# Patient Record
Sex: Female | Born: 1965 | Race: Black or African American | Hispanic: No | Marital: Single | Smoking: Never smoker
Health system: Southern US, Community
[De-identification: ages and names within clinical notes are randomized; demographics above are authoritative.]

## PROBLEM LIST (undated history)

## (undated) DIAGNOSIS — I10 Essential (primary) hypertension: Secondary | ICD-10-CM

## (undated) DIAGNOSIS — R51 Headache: Secondary | ICD-10-CM

## (undated) HISTORY — PX: APPENDECTOMY: SHX54

## (undated) HISTORY — PX: ABDOMINAL HYSTERECTOMY: SHX81

## (undated) HISTORY — PX: BREAST LUMPECTOMY: SHX2

---

## 1998-07-16 ENCOUNTER — Emergency Department (HOSPITAL_COMMUNITY): Admission: EM | Admit: 1998-07-16 | Discharge: 1998-07-16 | Payer: Self-pay | Admitting: Emergency Medicine

## 1998-10-14 ENCOUNTER — Encounter: Payer: Self-pay | Admitting: Emergency Medicine

## 1998-10-14 ENCOUNTER — Emergency Department (HOSPITAL_COMMUNITY): Admission: EM | Admit: 1998-10-14 | Discharge: 1998-10-14 | Payer: Self-pay | Admitting: Emergency Medicine

## 1999-01-29 ENCOUNTER — Emergency Department (HOSPITAL_COMMUNITY): Admission: EM | Admit: 1999-01-29 | Discharge: 1999-01-29 | Payer: Self-pay | Admitting: Emergency Medicine

## 1999-01-29 ENCOUNTER — Encounter: Payer: Self-pay | Admitting: Emergency Medicine

## 1999-06-06 ENCOUNTER — Emergency Department (HOSPITAL_COMMUNITY): Admission: EM | Admit: 1999-06-06 | Discharge: 1999-06-06 | Payer: Self-pay | Admitting: Emergency Medicine

## 1999-08-19 ENCOUNTER — Emergency Department (HOSPITAL_COMMUNITY): Admission: EM | Admit: 1999-08-19 | Discharge: 1999-08-19 | Payer: Self-pay | Admitting: Emergency Medicine

## 1999-08-20 ENCOUNTER — Emergency Department (HOSPITAL_COMMUNITY): Admission: EM | Admit: 1999-08-20 | Discharge: 1999-08-21 | Payer: Self-pay

## 1999-11-14 ENCOUNTER — Emergency Department (HOSPITAL_COMMUNITY): Admission: EM | Admit: 1999-11-14 | Discharge: 1999-11-14 | Payer: Self-pay | Admitting: Emergency Medicine

## 1999-12-08 ENCOUNTER — Emergency Department (HOSPITAL_COMMUNITY): Admission: EM | Admit: 1999-12-08 | Discharge: 1999-12-08 | Payer: Self-pay | Admitting: Emergency Medicine

## 1999-12-08 ENCOUNTER — Encounter: Payer: Self-pay | Admitting: Emergency Medicine

## 2000-01-11 ENCOUNTER — Emergency Department (HOSPITAL_COMMUNITY): Admission: EM | Admit: 2000-01-11 | Discharge: 2000-01-11 | Payer: Self-pay | Admitting: Emergency Medicine

## 2000-06-24 ENCOUNTER — Emergency Department (HOSPITAL_COMMUNITY): Admission: EM | Admit: 2000-06-24 | Discharge: 2000-06-24 | Payer: Self-pay | Admitting: Emergency Medicine

## 2001-03-28 ENCOUNTER — Emergency Department (HOSPITAL_COMMUNITY): Admission: EM | Admit: 2001-03-28 | Discharge: 2001-03-28 | Payer: Self-pay | Admitting: Emergency Medicine

## 2001-04-11 ENCOUNTER — Emergency Department (HOSPITAL_COMMUNITY): Admission: EM | Admit: 2001-04-11 | Discharge: 2001-04-11 | Payer: Self-pay | Admitting: Emergency Medicine

## 2001-04-11 ENCOUNTER — Encounter: Payer: Self-pay | Admitting: Emergency Medicine

## 2001-06-02 ENCOUNTER — Encounter: Payer: Self-pay | Admitting: Emergency Medicine

## 2001-06-02 ENCOUNTER — Emergency Department (HOSPITAL_COMMUNITY): Admission: EM | Admit: 2001-06-02 | Discharge: 2001-06-02 | Payer: Self-pay | Admitting: Emergency Medicine

## 2001-09-13 ENCOUNTER — Encounter: Payer: Self-pay | Admitting: Emergency Medicine

## 2001-09-13 ENCOUNTER — Emergency Department (HOSPITAL_COMMUNITY): Admission: EM | Admit: 2001-09-13 | Discharge: 2001-09-13 | Payer: Self-pay | Admitting: Emergency Medicine

## 2001-11-21 ENCOUNTER — Emergency Department (HOSPITAL_COMMUNITY): Admission: EM | Admit: 2001-11-21 | Discharge: 2001-11-21 | Payer: Self-pay | Admitting: Emergency Medicine

## 2001-12-01 ENCOUNTER — Encounter: Payer: Self-pay | Admitting: Emergency Medicine

## 2001-12-01 ENCOUNTER — Emergency Department (HOSPITAL_COMMUNITY): Admission: EM | Admit: 2001-12-01 | Discharge: 2001-12-01 | Payer: Self-pay | Admitting: Emergency Medicine

## 2002-05-13 ENCOUNTER — Emergency Department (HOSPITAL_COMMUNITY): Admission: EM | Admit: 2002-05-13 | Discharge: 2002-05-13 | Payer: Self-pay | Admitting: *Deleted

## 2002-05-16 ENCOUNTER — Emergency Department (HOSPITAL_COMMUNITY): Admission: EM | Admit: 2002-05-16 | Discharge: 2002-05-16 | Payer: Self-pay | Admitting: Emergency Medicine

## 2002-12-17 ENCOUNTER — Emergency Department (HOSPITAL_COMMUNITY): Admission: EM | Admit: 2002-12-17 | Discharge: 2002-12-17 | Payer: Self-pay | Admitting: Emergency Medicine

## 2002-12-18 ENCOUNTER — Encounter: Payer: Self-pay | Admitting: Emergency Medicine

## 2003-12-12 ENCOUNTER — Emergency Department (HOSPITAL_COMMUNITY): Admission: EM | Admit: 2003-12-12 | Discharge: 2003-12-13 | Payer: Self-pay | Admitting: Emergency Medicine

## 2004-01-07 ENCOUNTER — Emergency Department (HOSPITAL_COMMUNITY): Admission: EM | Admit: 2004-01-07 | Discharge: 2004-01-07 | Payer: Self-pay

## 2004-11-05 ENCOUNTER — Emergency Department (HOSPITAL_COMMUNITY): Admission: EM | Admit: 2004-11-05 | Discharge: 2004-11-05 | Payer: Self-pay | Admitting: Emergency Medicine

## 2005-04-27 ENCOUNTER — Emergency Department (HOSPITAL_COMMUNITY): Admission: EM | Admit: 2005-04-27 | Discharge: 2005-04-28 | Payer: Self-pay | Admitting: Emergency Medicine

## 2005-06-08 ENCOUNTER — Emergency Department (HOSPITAL_COMMUNITY): Admission: EM | Admit: 2005-06-08 | Discharge: 2005-06-08 | Payer: Self-pay | Admitting: Emergency Medicine

## 2005-06-15 ENCOUNTER — Emergency Department (HOSPITAL_COMMUNITY): Admission: EM | Admit: 2005-06-15 | Discharge: 2005-06-16 | Payer: Self-pay | Admitting: *Deleted

## 2005-11-02 ENCOUNTER — Emergency Department (HOSPITAL_COMMUNITY): Admission: EM | Admit: 2005-11-02 | Discharge: 2005-11-02 | Payer: Self-pay | Admitting: Emergency Medicine

## 2005-11-11 ENCOUNTER — Emergency Department (HOSPITAL_COMMUNITY): Admission: EM | Admit: 2005-11-11 | Discharge: 2005-11-12 | Payer: Self-pay | Admitting: Emergency Medicine

## 2005-11-11 ENCOUNTER — Emergency Department (HOSPITAL_COMMUNITY): Admission: EM | Admit: 2005-11-11 | Discharge: 2005-11-11 | Payer: Self-pay | Admitting: Emergency Medicine

## 2005-12-02 ENCOUNTER — Emergency Department (HOSPITAL_COMMUNITY): Admission: EM | Admit: 2005-12-02 | Discharge: 2005-12-02 | Payer: Self-pay | Admitting: Emergency Medicine

## 2005-12-07 ENCOUNTER — Emergency Department (HOSPITAL_COMMUNITY): Admission: EM | Admit: 2005-12-07 | Discharge: 2005-12-07 | Payer: Self-pay | Admitting: Emergency Medicine

## 2006-04-14 ENCOUNTER — Observation Stay (HOSPITAL_COMMUNITY): Admission: EM | Admit: 2006-04-14 | Discharge: 2006-04-15 | Payer: Self-pay | Admitting: Emergency Medicine

## 2006-05-24 ENCOUNTER — Observation Stay (HOSPITAL_COMMUNITY): Admission: EM | Admit: 2006-05-24 | Discharge: 2006-05-26 | Payer: Self-pay | Admitting: Emergency Medicine

## 2006-06-01 ENCOUNTER — Inpatient Hospital Stay (HOSPITAL_COMMUNITY): Admission: AD | Admit: 2006-06-01 | Discharge: 2006-06-05 | Payer: Self-pay | Admitting: Obstetrics and Gynecology

## 2006-06-02 ENCOUNTER — Encounter (INDEPENDENT_AMBULATORY_CARE_PROVIDER_SITE_OTHER): Payer: Self-pay | Admitting: *Deleted

## 2006-06-17 ENCOUNTER — Emergency Department (HOSPITAL_COMMUNITY): Admission: EM | Admit: 2006-06-17 | Discharge: 2006-06-17 | Payer: Self-pay | Admitting: Emergency Medicine

## 2006-06-17 ENCOUNTER — Emergency Department (HOSPITAL_COMMUNITY): Admission: EM | Admit: 2006-06-17 | Discharge: 2006-06-18 | Payer: Self-pay | Admitting: Emergency Medicine

## 2006-06-24 ENCOUNTER — Emergency Department (HOSPITAL_COMMUNITY): Admission: EM | Admit: 2006-06-24 | Discharge: 2006-06-24 | Payer: Self-pay | Admitting: Family Medicine

## 2007-10-01 ENCOUNTER — Emergency Department (HOSPITAL_COMMUNITY): Admission: EM | Admit: 2007-10-01 | Discharge: 2007-10-01 | Payer: Self-pay | Admitting: Emergency Medicine

## 2007-11-03 ENCOUNTER — Emergency Department (HOSPITAL_COMMUNITY): Admission: EM | Admit: 2007-11-03 | Discharge: 2007-11-03 | Payer: Self-pay | Admitting: Emergency Medicine

## 2008-01-10 IMAGING — US US PELVIS COMPLETE
1 series · 3 of 3 positions shown · non-contrast
Comparison: Prior study dated 04/14/06.

CLINICAL DATA: Abdominal pain.  
 TRANSABDOMINAL PELVIC ULTRASOUND:
TECHNIQUE: Transabdominal ultrasound examination of the pelvis was performed including evaluation of the uterus, ovaries, adnexal regions, and pelvic cul-de-sac.

[Series 1: unknown · 0.15mm/px · 3 of 3 slices shown]
[im 1/3]
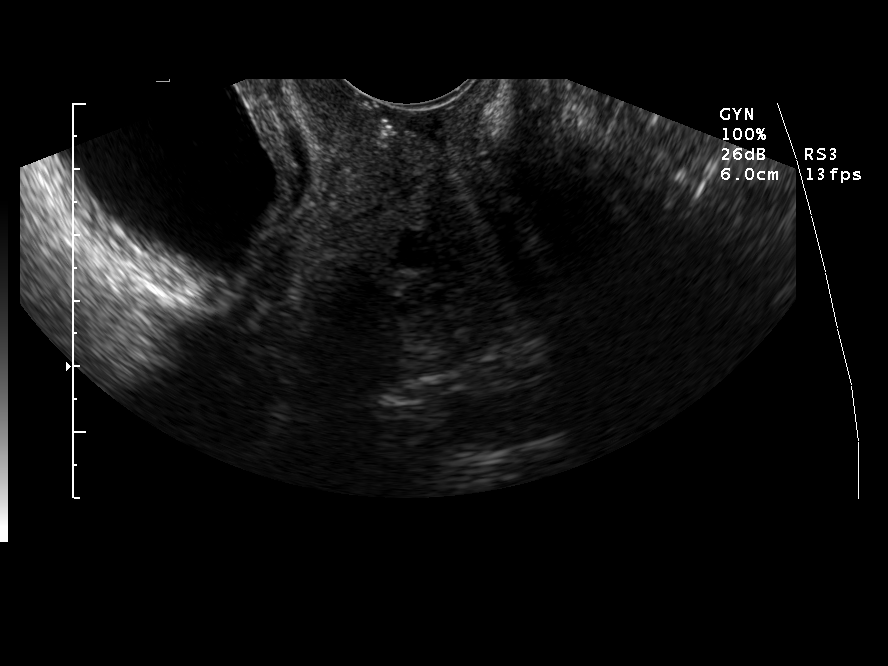
[im 2/3]
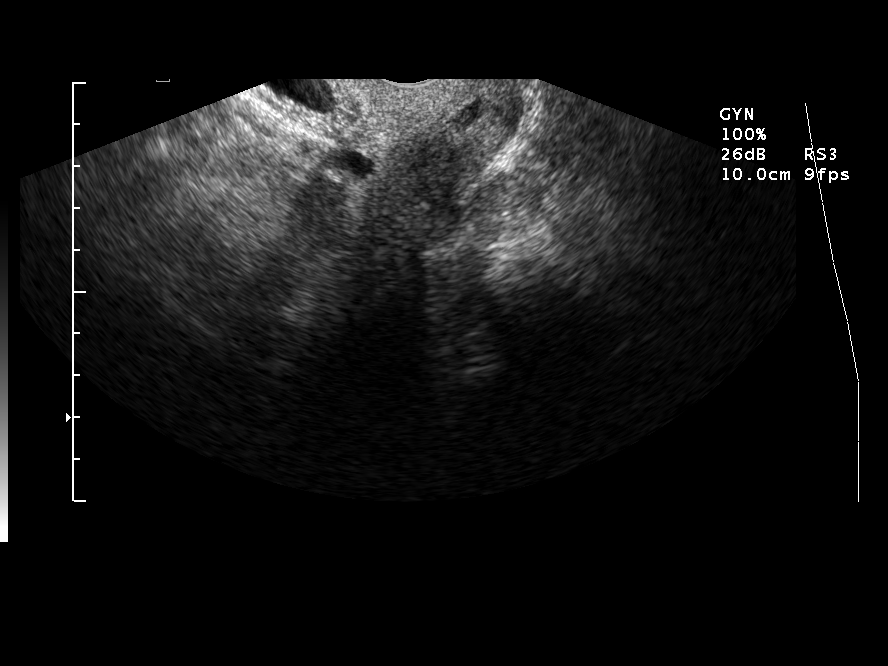
[im 3/3]
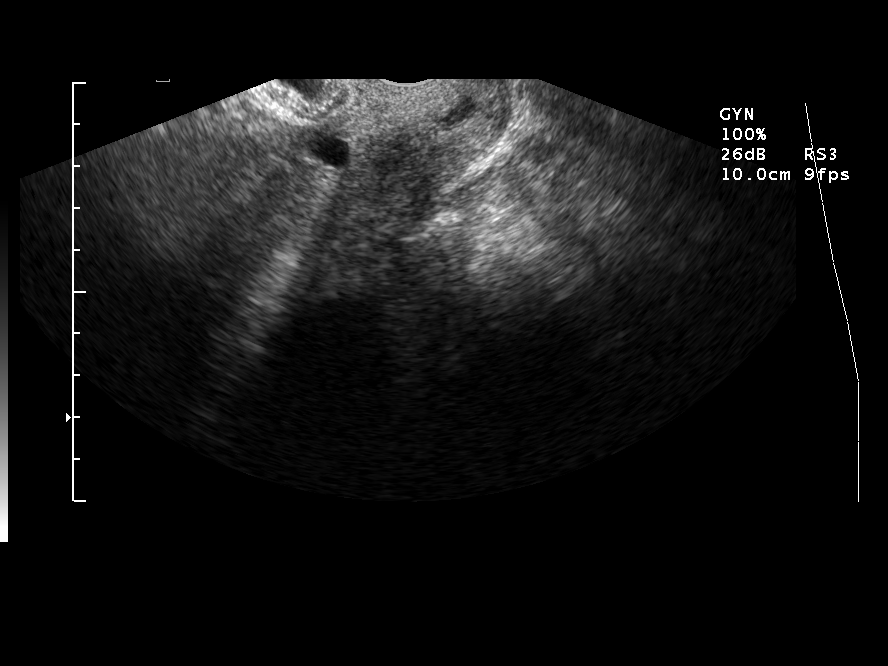

[3 of 3 positions shown; findings below may reference images not displayed]

FINDINGS: The uterus is enlarged.  On today?s study, this measures 18.0 x 12.4 x 13.3 cm in size and contains multiple fibroids.  It is difficult to clearly demarcate an individual fibroid as there are innumerable adjacent lesions.  Endometrial stripe thickness measures 25 mm.  The right ovary measures 4.0 x 2.5 x 2.8 cm in size and the left ovary 5.6 x 3.5 x 3.4 cm in size.  There are small physiologic follicles associated with the ovaries bilaterally.  There is no free pelvic fluid.  The uterus could not be evaluated by transvaginal pelvic ultrasound.
IMPRESSION: Enlarged likely leiomyomatous uterus as discussed above with an increase in size intervally.  No adnexal mass.

## 2008-04-28 ENCOUNTER — Emergency Department (HOSPITAL_COMMUNITY): Admission: EM | Admit: 2008-04-28 | Discharge: 2008-04-28 | Payer: Self-pay | Admitting: Emergency Medicine

## 2008-05-09 ENCOUNTER — Emergency Department (HOSPITAL_COMMUNITY): Admission: EM | Admit: 2008-05-09 | Discharge: 2008-05-10 | Payer: Self-pay | Admitting: Emergency Medicine

## 2008-08-21 ENCOUNTER — Emergency Department (HOSPITAL_COMMUNITY): Admission: EM | Admit: 2008-08-21 | Discharge: 2008-08-21 | Payer: Self-pay | Admitting: Emergency Medicine

## 2008-09-11 ENCOUNTER — Emergency Department (HOSPITAL_COMMUNITY): Admission: EM | Admit: 2008-09-11 | Discharge: 2008-09-11 | Payer: Self-pay | Admitting: Emergency Medicine

## 2008-11-03 ENCOUNTER — Emergency Department (HOSPITAL_COMMUNITY): Admission: EM | Admit: 2008-11-03 | Discharge: 2008-11-03 | Payer: Self-pay | Admitting: Emergency Medicine

## 2008-12-03 ENCOUNTER — Emergency Department (HOSPITAL_COMMUNITY): Admission: EM | Admit: 2008-12-03 | Discharge: 2008-12-03 | Payer: Self-pay | Admitting: Emergency Medicine

## 2008-12-05 ENCOUNTER — Emergency Department (HOSPITAL_COMMUNITY): Admission: EM | Admit: 2008-12-05 | Discharge: 2008-12-05 | Payer: Self-pay | Admitting: Emergency Medicine

## 2009-03-12 ENCOUNTER — Emergency Department (HOSPITAL_COMMUNITY): Admission: EM | Admit: 2009-03-12 | Discharge: 2009-03-12 | Payer: Self-pay | Admitting: Emergency Medicine

## 2009-05-12 ENCOUNTER — Emergency Department (HOSPITAL_COMMUNITY): Admission: EM | Admit: 2009-05-12 | Discharge: 2009-05-12 | Payer: Self-pay | Admitting: Emergency Medicine

## 2009-11-14 ENCOUNTER — Emergency Department (HOSPITAL_COMMUNITY): Admission: EM | Admit: 2009-11-14 | Discharge: 2009-11-14 | Payer: Self-pay | Admitting: Emergency Medicine
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
## 2010-02-01 ENCOUNTER — Emergency Department (HOSPITAL_BASED_OUTPATIENT_CLINIC_OR_DEPARTMENT_OTHER): Admission: EM | Admit: 2010-02-01 | Discharge: 2010-02-02 | Payer: Self-pay | Admitting: Emergency Medicine

## 2010-02-02 ENCOUNTER — Ambulatory Visit: Payer: Self-pay | Admitting: Diagnostic Radiology

## 2010-04-12 ENCOUNTER — Emergency Department (HOSPITAL_COMMUNITY): Admission: EM | Admit: 2010-04-12 | Discharge: 2010-04-13 | Payer: Self-pay | Admitting: Emergency Medicine

## 2010-05-19 ENCOUNTER — Emergency Department (HOSPITAL_BASED_OUTPATIENT_CLINIC_OR_DEPARTMENT_OTHER): Admission: EM | Admit: 2010-05-19 | Discharge: 2010-05-19 | Payer: Self-pay | Admitting: Emergency Medicine

## 2010-05-31 ENCOUNTER — Emergency Department (HOSPITAL_COMMUNITY): Admission: EM | Admit: 2010-05-31 | Discharge: 2010-06-01 | Payer: Self-pay | Admitting: Emergency Medicine

## 2010-08-31 ENCOUNTER — Emergency Department (HOSPITAL_BASED_OUTPATIENT_CLINIC_OR_DEPARTMENT_OTHER): Admission: EM | Admit: 2010-08-31 | Discharge: 2010-08-31 | Payer: Self-pay | Admitting: Emergency Medicine

## 2010-10-02 ENCOUNTER — Emergency Department (HOSPITAL_BASED_OUTPATIENT_CLINIC_OR_DEPARTMENT_OTHER): Admission: EM | Admit: 2010-10-02 | Discharge: 2010-10-02 | Payer: Self-pay | Admitting: Emergency Medicine

## 2010-10-16 ENCOUNTER — Emergency Department (HOSPITAL_BASED_OUTPATIENT_CLINIC_OR_DEPARTMENT_OTHER): Admission: EM | Admit: 2010-10-16 | Discharge: 2010-10-16 | Payer: Self-pay | Admitting: Emergency Medicine

## 2010-11-05 ENCOUNTER — Emergency Department (HOSPITAL_BASED_OUTPATIENT_CLINIC_OR_DEPARTMENT_OTHER): Admission: EM | Admit: 2010-11-05 | Discharge: 2010-11-05 | Payer: Self-pay | Admitting: Emergency Medicine

## 2011-01-17 ENCOUNTER — Encounter: Payer: Self-pay | Admitting: Internal Medicine

## 2011-01-18 ENCOUNTER — Encounter: Payer: Self-pay | Admitting: Internal Medicine

## 2011-03-11 LAB — BASIC METABOLIC PANEL WITH GFR
BUN: 8 mg/dL (ref 6–23)
CO2: 26 meq/L (ref 19–32)
Calcium: 9.8 mg/dL (ref 8.4–10.5)
Chloride: 103 meq/L (ref 96–112)
Creatinine, Ser: 0.8 mg/dL (ref 0.4–1.2)
GFR calc Af Amer: >60 mL/min (ref >60)
GFR calc non Af Amer: >60 mL/min (ref >60)
Glucose, Bld: 109 mg/dL — ABNORMAL HIGH (ref 70–99)
Potassium: 4.3 meq/L (ref 3.5–5.1)
Sodium: 143 meq/L (ref 135–145)

## 2011-03-15 LAB — COMPREHENSIVE METABOLIC PANEL
Alkaline Phosphatase: 77 U/L (ref 39–117)
BUN: 5 mg/dL — ABNORMAL LOW (ref 6–23)
Calcium: 9 mg/dL (ref 8.4–10.5)
Glucose, Bld: 126 mg/dL — ABNORMAL HIGH (ref 70–99)
Total Protein: 7 g/dL (ref 6.0–8.3)

## 2011-03-15 LAB — DIFFERENTIAL
Basophils Relative: 1 % (ref 0–1)
Lymphs Abs: 2.7 10*3/uL (ref 0.7–4.0)
Monocytes Relative: 11 % (ref 3–12)
Neutro Abs: 3.5 10*3/uL (ref 1.7–7.7)
Neutrophils Relative %: 49 % (ref 43–77)

## 2011-03-15 LAB — URINALYSIS, ROUTINE W REFLEX MICROSCOPIC
Glucose, UA: NEGATIVE mg/dL
Hgb urine dipstick: NEGATIVE
Ketones, ur: NEGATIVE mg/dL
Protein, ur: NEGATIVE mg/dL

## 2011-03-15 LAB — CBC
HCT: 39.8 % (ref 36.0–46.0)
Hemoglobin: 13.3 g/dL (ref 12.0–15.0)
MCHC: 33.3 g/dL (ref 30.0–36.0)
RDW: 13.5 % (ref 11.5–15.5)

## 2011-03-15 LAB — LIPASE, BLOOD: Lipase: 33 U/L (ref 11–59)

## 2011-03-18 ENCOUNTER — Emergency Department (HOSPITAL_BASED_OUTPATIENT_CLINIC_OR_DEPARTMENT_OTHER)
Admission: EM | Admit: 2011-03-18 | Discharge: 2011-03-18 | Disposition: A | Discharge status: Home / Self Care | Payer: Self-pay | Attending: Emergency Medicine | Admitting: Emergency Medicine

## 2011-03-18 DIAGNOSIS — R51 Headache: Secondary | ICD-10-CM | POA: Insufficient documentation

## 2011-03-18 DIAGNOSIS — I10 Essential (primary) hypertension: Secondary | ICD-10-CM | POA: Insufficient documentation

## 2011-03-18 LAB — DIFFERENTIAL
Basophils Absolute: 0.4 10*3/uL — ABNORMAL HIGH (ref 0.0–0.1)
Basophils Relative: 5 % — ABNORMAL HIGH (ref 0–1)
Eosinophils Absolute: 0.1 10*3/uL (ref 0.0–0.7)
Lymphs Abs: 3 10*3/uL (ref 0.7–4.0)
Neutro Abs: 3 10*3/uL (ref 1.7–7.7)

## 2011-03-18 LAB — COMPREHENSIVE METABOLIC PANEL
ALT: 19 U/L (ref 0–35)
AST: 24 U/L (ref 0–37)
Albumin: 4 g/dL (ref 3.5–5.2)
CO2: 30 mEq/L (ref 19–32)
Chloride: 102 mEq/L (ref 96–112)
GFR calc Af Amer: >60 mL/min (ref >60)
GFR calc non Af Amer: >60 mL/min (ref >60)
Potassium: 3.5 mEq/L (ref 3.5–5.1)
Sodium: 142 mEq/L (ref 135–145)
Total Bilirubin: 0.3 mg/dL (ref 0.3–1.2)

## 2011-03-18 LAB — BASIC METABOLIC PANEL
CO2: 29 mEq/L (ref 19–32)
Calcium: 9 mg/dL (ref 8.4–10.5)
GFR calc Af Amer: >60 mL/min (ref >60)
Sodium: 143 mEq/L (ref 135–145)

## 2011-03-18 LAB — URINALYSIS, ROUTINE W REFLEX MICROSCOPIC
Hgb urine dipstick: NEGATIVE
Nitrite: NEGATIVE
Specific Gravity, Urine: 1.03 (ref 1.005–1.030)
Urobilinogen, UA: 1 mg/dL (ref 0.0–1.0)

## 2011-03-18 LAB — CBC
MCV: 91.4 fL (ref 78.0–100.0)
Platelets: 263 10*3/uL (ref 150–400)
RBC: 4.55 MIL/uL (ref 3.87–5.11)
WBC: 7.2 10*3/uL (ref 4.0–10.5)

## 2011-03-31 LAB — CBC
HCT: 42.9 % (ref 36.0–46.0)
Hemoglobin: 14.3 g/dL (ref 12.0–15.0)
MCHC: 33.3 g/dL (ref 30.0–36.0)
Platelets: 246 10*3/uL (ref 150–400)
RDW: 13.9 % (ref 11.5–15.5)

## 2011-03-31 LAB — POCT I-STAT, CHEM 8
Calcium, Ion: 1.16 mmol/L (ref 1.12–1.32)
Chloride: 103 mEq/L (ref 96–112)
Glucose, Bld: 105 mg/dL — ABNORMAL HIGH (ref 70–99)
HCT: 46 % (ref 36.0–46.0)
Hemoglobin: 15.6 g/dL — ABNORMAL HIGH (ref 12.0–15.0)

## 2011-03-31 LAB — DIFFERENTIAL
Basophils Absolute: 0 10*3/uL (ref 0.0–0.1)
Basophils Relative: 0 % (ref 0–1)
Eosinophils Relative: 2 % (ref 0–5)
Lymphocytes Relative: 33 % (ref 12–46)
Monocytes Absolute: 0.7 10*3/uL (ref 0.1–1.0)
Neutro Abs: 4.8 10*3/uL (ref 1.7–7.7)

## 2011-03-31 LAB — D-DIMER, QUANTITATIVE: D-Dimer, Quant: 0.48 ug/mL-FEU (ref 0.00–0.48)

## 2011-03-31 LAB — POCT CARDIAC MARKERS

## 2011-05-14 NOTE — H&P (Signed)
[name], [name]              ACCOUNT NO.:  [id number]   MEDICAL RECORD NO.:  [id number]          PATIENT TYPE:  INP   LOCATION:  A428                          FACILITY:  [facility]   PHYSICIAN:  Tilda Burrow, M.D. DATE OF BIRTH:  1966-03-15   DATE OF ADMISSION:  06/01/2006  DATE OF DISCHARGE:  LH                                HISTORY & PHYSICAL   ADMISSION DIAGNOSES:  1.  Menorrhagia.  2.  Uterine fibroids.  3.  Anemia.  4.  Morbid obesity.   HISTORY OF PRESENT ILLNESS:  This 45 year old female gravida 3, para 2, last  menstrual period continuous x2 months, is admitted for abdominal  hysterectomy.  [name] was in the hospital one week ago for the same  complaints.  Had mild anemia and was unwilling to acknowledge her need for  going ahead with the surgery.  We attempted outpatient therapy with Megace  40 mg b.i.d. and iron and vitamins.  Unfortunately, she has continued to  bleed profusely.  She presents today dropping clots, bleeding and having  lots of pain associated with the cramping.   Exam shows the cervix is smooth, normal in appearance, grossly normal.  Pap  smear is unable to be completed.  GC and Chlamydia cultures are done.  Uterus is anterior, 14 to 16-week size, mobile and tender, causing the pain.  She understands the need for going ahead with interventions, so plans are to  admit, transfuse x2 units and prepare for hysterectomy.  Ovarian  preservation is considered desirable, though the patient is aware that  ovaries may need to be removed depending on clinical findings  intraoperative.   MEDICATIONS:  1.  Megace 40 mg b.i.d. until day, discontinued.  2.  Chromagen Forte twice daily.   ALLERGIES:  NONE.   PAST MEDICAL HISTORY:  Benign.   PAST SURGICAL HISTORY:  None recorded by patient.   INJURIES:  None.   PHYSICAL EXAMINATION:  GENERAL APPEARANCE:  A large-framed, solid-built,  overweight African American female.  VITAL SIGNS:  Height 5 feet 8  inches, weight 335.  Blood pressure 120/80,  pulse 80s.  HEENT:  Pupils are equal, round and reactive to light.  NECK:  Supple.  CHEST:  Clear to auscultation.  ABDOMEN:  Obese with actually a fairly thin anterior lower abdominal wall  with tender, hard, firm, mobile fibroid uterus noted 2 cm below the  umbilicus.  PELVIC:  On bimanual exam, vaginal axis good.  There are heavy clots per  vagina and heavy flow.  The cervix is multiparous without nodularity or  masses.  GC and Chlamydia cultures are done.  Uterus is anterior and will be  actually fair accessible on abdominal surgery.  EXTREMITIES:  Grossly normal without clubbing, cyanosis, or edema.   IMPRESSION:  Refractory bleeding from uterine fibroids, already worked up  with ultrasound at prior evaluations.   PLAN:  Admit, transfuse, hysterectomy in a.m.      Tilda Burrow, M.D.  Electronically Signed     JVF/MEDQ  D:  06/01/2006  T:  06/01/2006  Job:  [id number]
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
## 2011-05-14 NOTE — Discharge Summary (Signed)
[name], AERTS              ACCOUNT NO.:  [id number]   MEDICAL RECORD NO.:  [id number]          PATIENT TYPE:  INP   LOCATION:  A428                          FACILITY:  [facility]   PHYSICIAN:  Tilda Burrow, M.D. DATE OF BIRTH:  1966-09-26   DATE OF ADMISSION:  06/01/2006  DATE OF DISCHARGE:  06/10/2007LH                                 DISCHARGE SUMMARY   ADMISSION DIAGNOSES:  1.  Menorrhagia.  2.  Uterine fibroids.  3.  Anemia.  4.  Morbid obesity.   DISCHARGE DIAGNOSES:  1.  Menorrhagia, 626.2.  2.  Anemia, 280.0.  3.  Submucosa fibroids, 218.0.  4.  Intramural fibroids, 218.1.  5.  Morbid obesity, 278.01.  6.  Umbilical hernia 553.1.   PROCEDURE:  Transfusion x 3 units PRBC    Total Abdominal Hysterectomy-jvf    Umbilical Herniorraphy -jvf   HOSPITAL COURSE:  This 45 year old female, gravida 3, para 2, was admitted  for abdominal hysterectomy.  This was the second admission.  She had been  admitted one week earlier for severe bleeding, cramping and perfuse bleeding  that required ER visit and admission.  Uterus is 14 to 16 weeks size.   The patient was admitted June 01, 2006, underwent an abdominal hysterectomy  on June 02, 2006 having received three units of packed cells.  The patient  underwent hysterectomy with 350 mL blood loss.  She also had an umbilical  herniorrhaphy.  The incision was a midline vertical incision from the  umbilical hernia to the mons pubis.   Postoperatively, the patient did well, was afebrile.  Had a hemoglobin  initially of 11.3, hematocrit of 35.1, with postsurgical 10.1, hematocrit  30.8, quantitate 1607 the day of discharge.  Her blood type is B positive.  She had two units of packed cells administered preoperatively.  She had an  uneventful postsurgical recovery otherwise and was  stable for discharge in stable condition with discharge medications  consisting of Chromagen Forte 1 daily, Tylox 1-2 every 6 hours p.r.n. pain,  Motrin 1  by mouth every 8 hours for pain.  Follow up in one week at [facility].      Tilda Burrow, M.D.  Electronically Signed     JVF/MEDQ  D:  06/22/2006  T:  06/22/2006  Job:  [id number]

## 2011-05-14 NOTE — Op Note (Signed)
NAMECIPRIANA, [name]              ACCOUNT NO.:  [id number]   MEDICAL RECORD NO.:  [id number]          PATIENT TYPE:  INP   LOCATION:  A428                          FACILITY:  [facility]   PHYSICIAN:  Tilda Burrow, M.D. DATE OF BIRTH:  06/15/1966   DATE OF PROCEDURE:  06/02/2006  DATE OF DISCHARGE:                                 OPERATIVE REPORT   PREOPERATIVE DIAGNOSES:  1.  Menometrorrhagia secondary to intramural, submucosal, pedunculated,      polypoid uterine fibroids.  2.  Anemia.  3.  Morbid obesity.   POSTOPERATIVE DIAGNOSES:  1.  Menometrorrhagia secondary to intramural, submucosal, pedunculated,      polypoid uterine fibroids.  2.  Anemia.  3.  Morbid obesity.  4.  Umbilical hernia.   PROCEDURE:  1.  Total abdominal hysterectomy.  2.  Umbilical herniorrhaphy.   SURGEON:  Tilda Burrow, M.D.   ASSISTANT:  Morrie Sheldon, R.N.   ANESTHESIA:  General.   COMPLICATIONS:  None.   FINDINGS:  Huge fibroid uterus 16-20 weeks, with both pedunculated and  intramural fibroids.  Normal-appearing ovaries bilaterally.  One 3 cm  fibroid had apparently just fallen onto the left pelvic side wall where it  had attached to the surfaces and remained.   DESCRIPTION OF PROCEDURE:  The patient was taken to the operating room and  prepped and draped for an abdominal surgery.  She was prepped with non-  iodine-containing prep.  A midline vertical incision was performed from the  mons pubis, being careful to stay out of the panniculus crease, although  reaching all the way to just above the umbilicus, going to the left, to the  umbilicus itself.  Once the fascia was opened, careful blunt entry into the  peritoneal cavity was performed using the index finger and then the  peritoneal opening, opened cephalad and caudad using sharp dissection.   Attention was then directed to the uterus, which had multiple fibroids large  and small.  The broad ligaments were clamped, cut and suture ligated  on  either side.  The utero-ovarian ligaments were isolated, clamped, cut and  suture ligated on each side as well.  The right side required two attempts  at ligature to achieve hemostasis.  The bladder flap was developed  anteriorly, pushed down with ring forceps and a sponge stick.  Then the  uterine vessels were isolated, doubly clamped with a curved Heaney clamp,  transected and suture ligated.  The upper Cardinal ligaments were clamped,  cut and suture ligated using a straight Heaney clamp, knife dissection and  #0 chromic suture ligature.  At this time we could place _a malleable  retractor_ behind the uterus and sharply amputate off the uterine body from  the lower uterine segment.  A Lahey thyroid tenaculum was attached to the  remaining cervical and lower uterine segment stump.  We continued to march  down the lateral aspects of the cervix and lower uterine segment, clamping,  cutting and suture ligating with straight Heaney clamps, knife dissection  and #0 chromic suture ligature.  At this time a stab incision in the  anterior cervicovaginal fornix could be performed, and the cervix amputated  off of the vaginal cuff.  On the patient's left side we had freed up the  cuff a little bit too much, and so an Aldridge stitch was placed at each  lateral vaginal angle being particularly careful on the left side to re-  establish connective support between the vaginal cuff and the lower Cardinal  ligament.  There was a figure-of-eight suture placed in the posterior  aspects of the cuff itself, and then the remaining portion of the vaginal  cuff was closed transversely in a single layer, running, locking closure.  Hemostasis was satisfactory.  Inspection of the pedicles was performed.  Inspecting the pelvis revealed a 3 cm fibroid stuck to the pelvic floor on  the right side.  This could be elevated and peeled off with the underlying  peritoneum.  It was felt this was a pedunculated fibroid  that had probably  had its pedicle infarct and had then attached itself in the cul-de-sac.   Inspection of the pedicles revealed that we needed to put one additional  stitch on the right utero-ovarian ligament stump.  Then irrigation again  confirmed hemostasis.  The laparotomy equipment was removed, including the  three moistened tapes and the moistened green towel.  The peritoneum was  closed using running #2-0 chromic.  The fascia was closed in the midline  using three portions of running #0 Prolene suture, with good tissue edge  approximation.   UMBILICAL HERNIORRHAPHY:  The umbilical hernia repair was performed as a  portion of the closure.  The 2 cm wide x 3 cm deep umbilical hernia was  isolated, identified.  The peritoneal surfaces on the anterior aspects of  the hernia were peeled down.  Kocher clamps were used on the edges of the  hernia defect and figure-of-eight #0 Prolene was used to obliterate the  potential space.   The fascia closure of the midline incision involved #2-0 plain closure of  the peritoneal cavity, followed by a continuous running #0 Prolene suture to  close the midline fascia.  A subcutaneous Jackson-Pratt drain was placed in  the subcutaneous space and was allowed to exit through the patient's right  side of the mons pubis.  The sponge and needle counts were correct.   ESTIMATED BLOOD LOSS:  Was 350 mL.      Tilda Burrow, M.D.  Electronically Signed     JVF/MEDQ  D:  06/02/2006  T:  06/03/2006  Job:  [id number]

## 2011-05-14 NOTE — H&P (Signed)
NAMEKJERSTI, DITTMER NO.:  [id number]   MEDICAL RECORD NO.:  [id number]          PATIENT TYPE:  OBV   LOCATION:  A325                          FACILITY:  [facility]   PHYSICIAN:  Tilda Burrow, M.D. DATE OF BIRTH:  [birth year]   DATE OF ADMISSION:  05/24/2006  DATE OF DISCHARGE:  LH                                HISTORY & PHYSICAL   ADMISSION DIAGNOSIS:  Pelvic pain, uterine fibroids, morbid obesity.   HISTORY OF PRESENT ILLNESS:  This 45 year old female with no regular GYN  care is admitted for the second time this year for pain management to assist  with uterine fibroids and menstrual bleeding.  She was seen in the emergency  room overnight on May 24, 2006 for perceived incapacitating pain.  She has  been told in the past she has uterine fibroids.  Evaluation here included an  ultrasound of the pelvis, which showed a huge uterus of 18 x 12 x 13 cm  containing multiple fibroids with normal ovaries, thickened endometrial  stripe at 25 mm.  The patient was admitted with pain management.   MEDICATIONS:  Vicodin 10/500 every 6 hours as well as Megace 40 mg daily.   She complained of mild bleeding.   Laboratory evaluation included a hemoglobin of 11, hematocrit 36, white  count 9800 with a normal differential.  Electrolytes:  BUN 6, creatinine  0.8, potassium 3.8, otherwise normal electrolytes.   ALLERGIES:  PENICILLIN.   Last GYN exam was three years ago, prior to these multiple ER visits.   Patient is unemployed and does not wish to proceed at this time with surgery  if she can avoid it.  She wishes to come back at a later date.   PAST MEDICAL HISTORY:  Benign.   FAMILY HISTORY:  Positive for hypertension, cancer, diabetes.   PAST SURGICAL HISTORY:  Appendectomy, tubal ligation.   She does not smoke and does not drink.  Denies drug abuse.   She has had a tubal ligation for contraception many years ago.   PHYSICAL EXAMINATION:  VITAL SIGNS:  Blood  pressure 132/114, pulse 101,  respirations 20, temperature 98.9 with room air oxygenation of 99% by  oximetry.  Height 5 foot 8.  GENERAL:  She is a massively obese African-American female.  Weight 335  pounds.  HEENT:  Pupils round.  NECK:  Supple.  CARDIOVASCULAR:  Unremarkable.  ABDOMEN:  Palpable uterine fibroids to the umbilicus.  PELVIC:  Deferred due to menses and discomfort.  Will repeat as the  evaluation progresses, once the Pap smear is available.  Patient claims that  she is bleeding at this time.   ER MD reports a grossly normal exam.  Review of records show a similar  pelvic exam on March 17th in the emergency room was positive for the same  findings.   IMPRESSION:  1.  Uterine fibroids, 20 weeks.  2.  Dysmenorrhea.   PLAN:  Admit.  Symptomatic pain management.  Consider hysterectomy if  unrelieved within 48 hours.  Will try to honor the patient's request for  delayed surgery if pain symptoms  improve.  Will need endometrial biopsy and  Pap smear prior to procedure.      Tilda Burrow, M.D.  Electronically Signed     JVF/MEDQ  D:  05/26/2006  T:  05/26/2006  Job:  [id number]

## 2011-05-14 NOTE — Discharge Summary (Signed)
[name], [name] NO.:  [id number]   MEDICAL RECORD NO.:  [id number]          PATIENT TYPE:  OBV   LOCATION:  A336                          FACILITY:  [facility]   PHYSICIAN:  Langley Gauss, MD     DATE OF BIRTH:  1966-02-15   DATE OF ADMISSION:  04/14/2006  DATE OF DISCHARGE:  04/20/2007LH                                 DISCHARGE SUMMARY   The patient presented to the emergency room as a GYN unassigned patient. She  was initially evaluated by Dr. Margretta Ditty in the emergency room who  subsequently requested a consultation. A consultation provided by Dr. Roylene Reason. Lisette Grinder in the emergency room April 14, 2006. See consultation dictated  job [id number].   HISTORY OF PRESENT ILLNESS:  The patient was noted to have long-term vaginal  bleeding with resultant anemia. The patient was adverse to any surgical  intervention but at time of disposition she was advised that diagnostic and  therapeutic D&C or endometrial biopsy would likely be indicated.   PERTINENT RADIOLOGICAL STUDIES:  An ultrasound performed in the department  of radiology April 14, 2006 revealed left ovary normal in size, right ovary  not visualized, no free pelvic fluid and large uterus containing multiple  fibroids, largest at 4.7 cm in diameter. Unable to adequately ascertain  endometrial thickness due to the presence of the submucosal fibroid.   HOSPITAL COURSE:  The patient was noted to have heavier vaginal bleeding  prior to presentation. She was not bleeding in an uncontrolled manner in the  emergency room, thus this in combination with her reluctance to proceed with  any surgical intervention, patient was managed expectantly with long-term  care being outlined previously.   INITIAL LABORATORY STUDIES:  Hemoglobin 8.0, hematocrit 26.3. The patient  subsequently was transfused 2 units of packed red blood cells with final H&H  obtained April 15, 2006 of 8.8 and 28.5.   DISPOSITION:  The patient was  advised of the need for probable surgical  intervention in the future. She likewise is aware that my office will be  closing and I will not be available to provide ongoing care for her but she  is advised of the other gynecological care providers in the immediate area.   DISCHARGE MEDICATIONS:  1.  Megace 40 mg p.o. daily for endometrial suppression.  2.  Hemocyte 1 p.o. daily.  3.  Lortab 10/500 1/2 to 1 p.o. q.6 h p.r.n.   HOSPITAL COURSE:  The patient was treated with IV fluids. She did receive 2  units of packed red blood cells and was also treated with IV Premarin for  acute stabilization of the endometrium. During this hospital stay, she had  minimal vaginal bleeding, had no signs or symptoms anemia, was fully  ambulatory, was very tolerant of what undoubtedly is a longstanding problem  as exhibited with her MCV of 66.1. On April 15, 2006, the patient is having  minimal vaginal bleeding. She is adamant with her desire for discharge and  does not desire surgical management at  present but again discussed with her the abnormal ultrasound and the  result  of anemia due to the heavy bleeding thus she is highly recommended to  continue to followup with outpatient care rather than presenting to the  emergency room.      Langley Gauss, MD  Electronically Signed     DC/MEDQ  D:  06/24/2006  T:  06/24/2006  Job:  [id number]

## 2011-05-14 NOTE — Discharge Summary (Signed)
[name], [name]              ACCOUNT NO.:  [id number]   MEDICAL RECORD NO.:  [id number]          PATIENT TYPE:  OBV   LOCATION:  A325                          FACILITY:  [facility]   PHYSICIAN:  Tilda Burrow, M.D. DATE OF BIRTH:  Apr 10, 1966   DATE OF ADMISSION:  05/24/2006  DATE OF DISCHARGE:  05/31/2007LH                                 DISCHARGE SUMMARY   ADMISSION DIAGNOSES:  1.  Symptomatic uterine fibroid.  2.  Morbid obesity.  3.  Absence of recent gynecologic care.   DISCHARGE DIAGNOSES:  1.  Symptomatic uterine fibroid.  2.  Morbid obesity.  3.  Absence of recent gynecologic care.  4.  Anemia, secondary to menses.  5.  Pelvic pain.   PROCEDURE:  Pain management with IV analgesics.   DISCHARGE MEDICATIONS:  1.  Vicodin ES 7.5 mg 20 tablets one to two q.4h. p.r.n. pain.  No refills.  2.  Chromagen Forte twice daily.   This large-framed morbidly obese African-American female was admitted for  pain management.  She has had multiple ER visits for menstrual related  problems.  She has not had a GYN exam in 3 years.   HOSPITAL COURSE:  A 2-day admission for pain management resulted in  controlled pain symptoms sufficiently that the patient could go home and  delay surgery to a later date, as per her request.  She insists that this  time, she will not become noncompliant with followup care.  Patient presents  problem; she is not considered an ideal embolization candidate due to body  habitus, obesity, and huge multiple number of fibroids.   FOLLOW UP:  One week in our office for Pap smear, endometrial biopsy and  subsequent exam and subsequent surgical hysterectomy to be performed during  June.      Tilda Burrow, M.D.  Electronically Signed     JVF/MEDQ  D:  05/26/2006  T:  05/27/2006  Job:  [id number]   cc:   [facility]

## 2011-05-25 ENCOUNTER — Emergency Department (HOSPITAL_BASED_OUTPATIENT_CLINIC_OR_DEPARTMENT_OTHER)
Admission: EM | Admit: 2011-05-25 | Discharge: 2011-05-25 | Disposition: A | Discharge status: Home / Self Care | Payer: Self-pay | Attending: Emergency Medicine | Admitting: Emergency Medicine

## 2011-05-25 DIAGNOSIS — I10 Essential (primary) hypertension: Secondary | ICD-10-CM | POA: Insufficient documentation

## 2011-05-25 DIAGNOSIS — R51 Headache: Secondary | ICD-10-CM | POA: Insufficient documentation

## 2011-05-25 LAB — BASIC METABOLIC PANEL
CO2: 26 mEq/L (ref 19–32)
Calcium: 9.2 mg/dL (ref 8.4–10.5)
Chloride: 102 mEq/L (ref 96–112)
Glucose, Bld: 133 mg/dL — ABNORMAL HIGH (ref 70–99)
Sodium: 139 mEq/L (ref 135–145)

## 2011-05-25 LAB — CBC
HCT: 41.1 % (ref 36.0–46.0)
Hemoglobin: 14.5 g/dL (ref 12.0–15.0)
MCH: 29.8 pg (ref 26.0–34.0)
MCHC: 35.3 g/dL (ref 30.0–36.0)
MCV: 84.6 fL (ref 78.0–100.0)
RBC: 4.86 MIL/uL (ref 3.87–5.11)

## 2011-05-25 LAB — URINALYSIS, ROUTINE W REFLEX MICROSCOPIC
Bilirubin Urine: NEGATIVE
Glucose, UA: NEGATIVE mg/dL
Hgb urine dipstick: NEGATIVE
Ketones, ur: NEGATIVE mg/dL
Protein, ur: NEGATIVE mg/dL
pH: 6 (ref 5.0–8.0)

## 2011-06-29 ENCOUNTER — Emergency Department (HOSPITAL_BASED_OUTPATIENT_CLINIC_OR_DEPARTMENT_OTHER)
Admission: EM | Admit: 2011-06-29 | Discharge: 2011-06-29 | Disposition: A | Discharge status: Home / Self Care | Payer: Self-pay | Attending: Emergency Medicine | Admitting: Emergency Medicine

## 2011-06-29 DIAGNOSIS — I10 Essential (primary) hypertension: Secondary | ICD-10-CM | POA: Insufficient documentation

## 2011-06-29 DIAGNOSIS — J029 Acute pharyngitis, unspecified: Secondary | ICD-10-CM | POA: Insufficient documentation

## 2011-08-03 ENCOUNTER — Emergency Department (HOSPITAL_BASED_OUTPATIENT_CLINIC_OR_DEPARTMENT_OTHER)
Admission: EM | Admit: 2011-08-03 | Discharge: 2011-08-03 | Disposition: A | Discharge status: Home / Self Care | Payer: Self-pay | Attending: Emergency Medicine | Admitting: Emergency Medicine

## 2011-08-03 ENCOUNTER — Encounter: Payer: Self-pay | Admitting: *Deleted

## 2011-08-03 DIAGNOSIS — J029 Acute pharyngitis, unspecified: Secondary | ICD-10-CM | POA: Insufficient documentation

## 2011-08-03 DIAGNOSIS — M79609 Pain in unspecified limb: Secondary | ICD-10-CM | POA: Insufficient documentation

## 2011-08-03 DIAGNOSIS — M79605 Pain in left leg: Secondary | ICD-10-CM

## 2011-08-03 DIAGNOSIS — I10 Essential (primary) hypertension: Secondary | ICD-10-CM | POA: Insufficient documentation

## 2011-08-03 HISTORY — DX: Essential (primary) hypertension: I10

## 2011-08-03 MED ORDER — HYDROCHLOROTHIAZIDE 25 MG PO TABS: 25.0000 mg | ORAL_TABLET | Freq: Every day | ORAL | Status: DC

## 2011-08-03 MED ORDER — HYDROCODONE-ACETAMINOPHEN 5-325 MG PO TABS: 2.0000 | ORAL_TABLET | ORAL | Status: AC | PRN

## 2011-08-03 MED ORDER — AZITHROMYCIN 250 MG PO TABS: 250.0000 mg | ORAL_TABLET | Freq: Every day | ORAL | Status: AC

## 2011-08-03 NOTE — ED Notes (Signed)
Pt amb to room 4 with quick steady gait.  Pt reports her bp at home last night with her home machine was "140"s over 100"  Pt states she is concerned, and requests that we check it.  Denies any ha, but states she ran out of her htn meds on Saturday.  Also c/o lle "sore" feeling.

## 2011-08-03 NOTE — ED Provider Notes (Signed)
History     CSN: [id number] Arrival date & time: 08/03/2011 11:25 AM  Chief Complaint  Patient presents with  . Hypertension   Patient is a 45 y.o. female presenting with hypertension. The history is provided by the patient.  Hypertension This is a new problem. The current episode started 1 to 4 weeks ago. The problem occurs daily. The problem has been unchanged. Associated symptoms include a fever and a sore throat. The symptoms are aggravated by nothing. She has tried nothing for the symptoms. The treatment provided no relief.  Hypertension This is a new problem. The current episode started 1 to 4 weeks ago. The problem occurs daily. The problem has been unchanged. The symptoms are aggravated by nothing. She has tried nothing for the symptoms. The treatment provided no relief.   patient reports she is out of hydrochlorothiazide. She complains of a sore throat. Patient complains of soreness to left thigh. Patient reports she has soreness in her legs on and off. Patient reports she does not currently have a medical doctor.  Past Medical History  Diagnosis Date  . Hypertension     History reviewed. No pertinent past surgical history.  History reviewed. No pertinent family history.  History  Substance Use Topics  . Smoking status: Current Everyday Smoker  . Smokeless tobacco: Not on file  . Alcohol Use: No    OB History    Grav Para Term Preterm Abortions TAB SAB Ect Mult Living                  Review of Systems  Constitutional: Positive for fever.  HENT: Positive for sore throat.   All other systems reviewed and are negative.    Physical Exam  BP 121/57  Pulse 86  Temp(Src) 98.1 F (36.7 C) (Oral)  Resp 18  SpO2 99%  Physical Exam  Constitutional: She is oriented to person, place, and time. She appears well-developed and well-nourished.       Morbidly obese  HENT:  Head: Normocephalic and atraumatic.  Eyes: Conjunctivae and EOM are normal. Pupils are equal,  round, and reactive to light.  Neck: Normal range of motion. Neck supple.  Cardiovascular: Normal rate and regular rhythm.   Pulmonary/Chest: Effort normal and breath sounds normal.  Abdominal: Soft. Bowel sounds are normal.  Musculoskeletal: Normal range of motion.  Neurological: She is alert and oriented to person, place, and time. She has normal reflexes.  Skin: Skin is warm and dry.  Psychiatric: She has a normal mood and affect.    ED Course  Procedures  MDM Patient was counseled that we will refill her hydrochlorothiazide. I will treat her with zithromax for pharyngitis. Patient is given a referral to Dr. Bethanne Ginger office for followup of blood pressure and medical management.      Langston Masker, [place] 08/03/11 1241  Langston Masker, [place] 08/03/11 1245 Medical screening examination/treatment/procedure(s) were performed by non-physician practitioner and as supervising physician I was immediately available for consultation/collaboration.  Jasmine Awe, MD 08/03/11 1252

## 2011-10-01 LAB — DIFFERENTIAL
Eosinophils Absolute: 0.2 10*3/uL (ref 0.0–0.7)
Eosinophils Relative: 2 % (ref 0–5)
Lymphocytes Relative: 34 % (ref 12–46)
Lymphs Abs: 2.8 10*3/uL (ref 0.7–4.0)
Monocytes Absolute: 0.8 10*3/uL (ref 0.1–1.0)
Monocytes Relative: 10 % (ref 3–12)

## 2011-10-01 LAB — URINALYSIS, ROUTINE W REFLEX MICROSCOPIC
Glucose, UA: NEGATIVE mg/dL
Hgb urine dipstick: NEGATIVE
Protein, ur: NEGATIVE mg/dL
Specific Gravity, Urine: 1.004 — ABNORMAL LOW (ref 1.005–1.030)
pH: 6.5 (ref 5.0–8.0)

## 2011-10-01 LAB — CBC
HCT: 42 % (ref 36.0–46.0)
Hemoglobin: 14.1 g/dL (ref 12.0–15.0)
MCV: 90.4 fL (ref 78.0–100.0)
RBC: 4.65 MIL/uL (ref 3.87–5.11)
WBC: 8.3 10*3/uL (ref 4.0–10.5)

## 2011-10-01 LAB — COMPREHENSIVE METABOLIC PANEL
AST: 23 U/L (ref 0–37)
CO2: 30 mEq/L (ref 19–32)
Calcium: 9.4 mg/dL (ref 8.4–10.5)
Chloride: 103 mEq/L (ref 96–112)
Creatinine, Ser: 0.81 mg/dL (ref 0.4–1.2)
GFR calc non Af Amer: >60 mL/min (ref >60)
Glucose, Bld: 99 mg/dL (ref 70–99)
Total Bilirubin: 0.4 mg/dL (ref 0.3–1.2)

## 2011-10-01 LAB — LIPASE, BLOOD: Lipase: 26 U/L (ref 11–59)

## 2011-10-20 ENCOUNTER — Encounter (HOSPITAL_BASED_OUTPATIENT_CLINIC_OR_DEPARTMENT_OTHER): Payer: Self-pay | Admitting: Emergency Medicine

## 2011-10-20 ENCOUNTER — Emergency Department (HOSPITAL_BASED_OUTPATIENT_CLINIC_OR_DEPARTMENT_OTHER)
Admission: EM | Admit: 2011-10-20 | Discharge: 2011-10-21 | Disposition: A | Discharge status: Other Acute Inpatient Hospital | Payer: Medicaid Other | Attending: Emergency Medicine | Admitting: Emergency Medicine

## 2011-10-20 DIAGNOSIS — R739 Hyperglycemia, unspecified: Secondary | ICD-10-CM

## 2011-10-20 DIAGNOSIS — B373 Candidiasis of vulva and vagina: Secondary | ICD-10-CM | POA: Insufficient documentation

## 2011-10-20 DIAGNOSIS — B3731 Acute candidiasis of vulva and vagina: Secondary | ICD-10-CM | POA: Insufficient documentation

## 2011-10-20 DIAGNOSIS — F17200 Nicotine dependence, unspecified, uncomplicated: Secondary | ICD-10-CM | POA: Insufficient documentation

## 2011-10-20 DIAGNOSIS — E119 Type 2 diabetes mellitus without complications: Secondary | ICD-10-CM

## 2011-10-20 DIAGNOSIS — I10 Essential (primary) hypertension: Secondary | ICD-10-CM | POA: Insufficient documentation

## 2011-10-20 LAB — CBC
HCT: 44.3 % (ref 36.0–46.0)
Hemoglobin: 16.2 g/dL — ABNORMAL HIGH (ref 12.0–15.0)
MCV: 83.3 fL (ref 78.0–100.0)
WBC: 6 10*3/uL (ref 4.0–10.5)

## 2011-10-20 LAB — DIFFERENTIAL
Eosinophils Relative: 1 % (ref 0–5)
Lymphocytes Relative: 33 % (ref 12–46)
Monocytes Absolute: 0.7 10*3/uL (ref 0.1–1.0)
Monocytes Relative: 12 % (ref 3–12)
Neutro Abs: 3.2 10*3/uL (ref 1.7–7.7)

## 2011-10-20 LAB — URINALYSIS, ROUTINE W REFLEX MICROSCOPIC
Bilirubin Urine: NEGATIVE
Nitrite: NEGATIVE
Protein, ur: NEGATIVE mg/dL
Specific Gravity, Urine: 1.038 — ABNORMAL HIGH (ref 1.005–1.030)
Urobilinogen, UA: 0.2 mg/dL (ref 0.0–1.0)

## 2011-10-20 LAB — GLUCOSE, CAPILLARY: Glucose-Capillary: >600 mg/dL (ref 70–99)

## 2011-10-20 LAB — URINE MICROSCOPIC-ADD ON

## 2011-10-20 MED ORDER — SODIUM CHLORIDE 0.9 % IV BOLUS (SEPSIS)
1000.0000 mL | Freq: Once | INTRAVENOUS | Status: AC
  Administered 2011-10-20: 1000 mL via INTRAVENOUS

## 2011-10-20 NOTE — ED Notes (Signed)
Warm blankets given.

## 2011-10-20 NOTE — ED Notes (Addendum)
Pt c/o vaginal pain and itching. Pt states she took monostat x 1 week ago without relief. Pt reports blurry vision and increased urination.

## 2011-10-21 ENCOUNTER — Inpatient Hospital Stay (HOSPITAL_COMMUNITY)
Admission: EM | Admit: 2011-10-21 | Discharge: 2011-10-23 | DRG: 638 | Disposition: A | Discharge status: Home / Self Care | Payer: Medicaid Other | Source: Other Acute Inpatient Hospital | Attending: Internal Medicine | Admitting: Internal Medicine

## 2011-10-21 DIAGNOSIS — E1100 Type 2 diabetes mellitus with hyperosmolarity without nonketotic hyperglycemic-hyperosmolar coma (NKHHC): Principal | ICD-10-CM | POA: Diagnosis present

## 2011-10-21 DIAGNOSIS — N760 Acute vaginitis: Secondary | ICD-10-CM | POA: Diagnosis present

## 2011-10-21 DIAGNOSIS — Z79899 Other long term (current) drug therapy: Secondary | ICD-10-CM

## 2011-10-21 DIAGNOSIS — E876 Hypokalemia: Secondary | ICD-10-CM | POA: Diagnosis present

## 2011-10-21 DIAGNOSIS — E871 Hypo-osmolality and hyponatremia: Secondary | ICD-10-CM | POA: Diagnosis present

## 2011-10-21 DIAGNOSIS — I10 Essential (primary) hypertension: Secondary | ICD-10-CM | POA: Diagnosis present

## 2011-10-21 DIAGNOSIS — Z880 Allergy status to penicillin: Secondary | ICD-10-CM

## 2011-10-21 DIAGNOSIS — Z794 Long term (current) use of insulin: Secondary | ICD-10-CM

## 2011-10-21 LAB — BASIC METABOLIC PANEL
CO2: 25 mEq/L (ref 19–32)
Chloride: 97 mEq/L (ref 96–112)
Glucose, Bld: 336 mg/dL — ABNORMAL HIGH (ref 70–99)
Potassium: 3.1 mEq/L — ABNORMAL LOW (ref 3.5–5.1)
Sodium: 133 mEq/L — ABNORMAL LOW (ref 135–145)

## 2011-10-21 LAB — WET PREP, GENITAL

## 2011-10-21 LAB — GLUCOSE, CAPILLARY
Glucose-Capillary: >600 mg/dL (ref 70–99)
Glucose-Capillary: 325 mg/dL — ABNORMAL HIGH (ref 70–99)
Glucose-Capillary: 325 mg/dL — ABNORMAL HIGH (ref 70–99)
Glucose-Capillary: 260 mg/dL — ABNORMAL HIGH (ref 70–99)
Glucose-Capillary: 313 mg/dL — ABNORMAL HIGH (ref 70–99)

## 2011-10-21 LAB — COMPREHENSIVE METABOLIC PANEL
AST: 30 U/L (ref 0–37)
BUN: 8 mg/dL (ref 6–23)
CO2: 26 mEq/L (ref 19–32)
Calcium: 9.8 mg/dL (ref 8.4–10.5)
Chloride: 84 mEq/L — ABNORMAL LOW (ref 96–112)
Creatinine, Ser: 0.7 mg/dL (ref 0.50–1.10)
GFR calc Af Amer: >90 mL/min (ref >90)
GFR calc non Af Amer: >90 mL/min (ref >90)
Glucose, Bld: 857 mg/dL (ref 70–99)
Total Bilirubin: 0.4 mg/dL (ref 0.3–1.2)

## 2011-10-21 LAB — GC/CHLAMYDIA PROBE AMP, GENITAL: GC Probe Amp, Genital: NEGATIVE

## 2011-10-21 LAB — TSH: TSH: 2.555 u[IU]/mL (ref 0.350–4.500)

## 2011-10-21 LAB — LIPID PANEL
Cholesterol: 176 mg/dL (ref 0–200)
LDL Cholesterol: 103 mg/dL — ABNORMAL HIGH (ref 0–99)
VLDL: 31 mg/dL (ref 0–40)

## 2011-10-21 LAB — LACTIC ACID, PLASMA: Lactic Acid, Venous: 2.8 mmol/L — ABNORMAL HIGH (ref 0.5–2.2)

## 2011-10-21 MED ORDER — FLUCONAZOLE 50 MG PO TABS
150.0000 mg | ORAL_TABLET | Freq: Once | ORAL | Status: AC
  Administered 2011-10-21: 150 mg via ORAL
  Filled 2011-10-21: qty 1

## 2011-10-21 MED ORDER — DEXTROSE-NACL 5-0.45 % IV SOLN: INTRAVENOUS | Status: DC

## 2011-10-21 MED ORDER — SODIUM CHLORIDE 0.9 % IV SOLN
INTRAVENOUS | Status: DC
  Administered 2011-10-21: 01:00:00 via INTRAVENOUS

## 2011-10-21 MED ORDER — SODIUM CHLORIDE 0.9 % IV SOLN
INTRAVENOUS | Status: DC
  Administered 2011-10-21: 01:00:00 via INTRAVENOUS
  Filled 2011-10-21: qty 3

## 2011-10-21 MED ORDER — DEXTROSE 50 % IV SOLN: 25.0000 mL | INTRAVENOUS | Status: DC | PRN

## 2011-10-21 NOTE — ED Provider Notes (Signed)
History     CSN: [id number] Arrival date & time: 10/20/2011 11:30 PM   First MD Initiated Contact with Patient 10/21/11 0015      Chief Complaint  Patient presents with  . Vaginal Itching    (Consider location/radiation/quality/duration/timing/severity/associated sxs/prior treatment) Patient is a 45 y.o. female presenting with vaginal itching. The history is provided by the patient.  Vaginal Itching This is a new problem. The current episode started more than 1 week ago. The problem occurs constantly. The problem has not changed since onset.Pertinent negatives include no chest pain, no abdominal pain, no headaches and no shortness of breath. The symptoms are aggravated by nothing. The symptoms are relieved by nothing.   patient's date she tried Monistat and it did not help at all. Additionally patient has noticed that she's had increased thirst polyuria and polydipsia. She's also had some aching in her lower extremities and she's been feeling lightheaded with some blurred vision. Patient states she has history of borderline diabetes but is not a diabetic.  Past Medical History  Diagnosis Date  . Hypertension     Past Surgical History  Procedure Date  . Breast lumpectomy   . Appendectomy   . Abdominal hysterectomy   . Cesarean section     No family history on file.  History  Substance Use Topics  . Smoking status: Current Some Day Smoker  . Smokeless tobacco: Not on file  . Alcohol Use: No    OB History    Grav Para Term Preterm Abortions TAB SAB Ect Mult Living                  Review of Systems  Constitutional: Positive for fatigue. Negative for fever and diaphoresis.  Respiratory: Negative for shortness of breath.   Cardiovascular: Negative for chest pain.  Gastrointestinal: Negative for abdominal pain.  Neurological: Negative for headaches.  All other systems reviewed and are negative.    Allergies  Penicillins  Home Medications   Current Outpatient  Rx  Name Route Sig Dispense Refill  . HYDROCHLOROTHIAZIDE 25 MG PO TABS Oral Take 1 tablet (25 mg total) by mouth daily. 30 tablet 3  . HYDROCODONE-ACETAMINOPHEN 5-325 MG PO TABS Oral Take 1 tablet by mouth as needed.      Marland Kitchen TRAMADOL HCL 50 MG PO TABS Oral Take 50 mg by mouth every 6 (six) hours as needed. Pt states she took two of her brother's tramadol 50mg  tablets last night, but she does not have her own rx.       BP 152/94  Pulse 96  Temp(Src) 98.1 F (36.7 C) (Oral)  Resp 18  Wt 360 lb (163.295 kg)  SpO2 97%  Physical Exam  Nursing note and vitals reviewed. Constitutional: No distress.       Obese  HENT:  Head: Normocephalic and atraumatic.  Right Ear: External ear normal.  Left Ear: External ear normal.  Eyes: Conjunctivae are normal. Right eye exhibits no discharge. Left eye exhibits no discharge. No scleral icterus.  Neck: Neck supple. No tracheal deviation present.  Cardiovascular: Normal rate, regular rhythm and intact distal pulses.   Pulmonary/Chest: Effort normal and breath sounds normal. No stridor. No respiratory distress. She has no wheezes. She has no rales.  Abdominal: Soft. Bowel sounds are normal. She exhibits no distension. There is no tenderness. There is no rebound and no guarding.  Genitourinary: There is rash on the right labia. There is rash and lesion (Small papule left labia,  no ulcerations or  vesicles) on the left labia. Uterus is not tender. Cervix exhibits no motion tenderness. Right adnexum displays no mass and no tenderness. Left adnexum displays no mass and no tenderness. There is erythema and bleeding around the vagina. No signs of injury around the vagina. Vaginal discharge (White) found.  Musculoskeletal: She exhibits no edema and no tenderness.  Neurological: She is alert. She has normal strength. No sensory deficit. Cranial nerve deficit:  no gross defecits noted. She exhibits normal muscle tone. She displays no seizure activity. Coordination  normal.  Skin: Skin is warm and dry. No rash noted.  Psychiatric: She has a normal mood and affect.    ED Course  Procedures (including critical care time) IV insulin infusion was started. The patient has remained stable and she has only had a modest response at this point. 2:17 AM  2:48 AM case discussed with Dr. Beverly Gust, Leonette Most . Patient will be transferred to [facility] for further treatment.  Patient remains hyperglycemic. We'll continue to monitor.  CRITICAL CARE Performed by: Celene Kras   Total critical care time: 35  Critical care time was exclusive of separately billable procedures and treating other patients.  Critical care was necessary to treat or prevent imminent or life-threatening deterioration.  Critical care was time spent personally by me on the following activities: development of treatment plan with patient and/or surrogate as well as nursing, discussions with consultants, evaluation of patient's response to treatment, examination of patient, obtaining history from patient or surrogate, ordering and performing treatments and interventions, ordering and review of laboratory studies, ordering and review of radiographic studies, pulse oximetry and re-evaluation of patient's condition.    Medications      insulin regular (HUMULIN R,NOVOLIN R) 1 Units/mL in sodium chloride 0.9 % 100 mL infusion (10.8 Units/hr Intravenous Rate/Dose Change 10/21/11 0156)    0.9 %  sodium chloride infusion (  Intravenous New Bag 10/21/11 0030)  fluconazole (DIFLUCAN) tablet 150 mg (not administered)  sodium chloride 0.9 % bolus 1,000 mL (1000 mL Intravenous Given 10/20/11 2345)    Labs Reviewed  URINALYSIS, ROUTINE W REFLEX MICROSCOPIC - Abnormal; Notable for the following:    Specific Gravity, Urine 1.038 (*)    Glucose, UA >1000 (*)    Hgb urine dipstick TRACE (*)    Ketones, ur 15 (*)    All other components within normal limits  GLUCOSE, CAPILLARY - Abnormal; Notable for  the following:    Glucose-Capillary >600 (*)    All other components within normal limits  CBC - Abnormal; Notable for the following:    RBC 5.32 (*)    Hemoglobin 16.2 (*)    MCHC 36.6 (*)    All other components within normal limits  COMPREHENSIVE METABOLIC PANEL - Abnormal; Notable for the following:    Sodium 125 (*)    Chloride 84 (*)    Glucose, Bld 857 (*)    Total Protein 8.7 (*)    ALT 40 (*)    All other components within normal limits  LACTIC ACID, PLASMA - Abnormal; Notable for the following:    Lactic Acid, Venous 2.8 (*)    All other components within normal limits  WET PREP, GENITAL - Abnormal; Notable for the following:    Yeast, Wet Prep FEW (*)    Clue Cells, Wet Prep FEW (*)    WBC, Wet Prep HPF POC MODERATE (*) MODERATE BACTERIA SEEN   All other components within normal limits  GLUCOSE, CAPILLARY - Abnormal; Notable for the  following:    Glucose-Capillary >600 (*)    All other components within normal limits  URINE MICROSCOPIC-ADD ON  DIFFERENTIAL  POCT CBG MONITORING  POCT CBG MONITORING  GC/CHLAMYDIA PROBE AMP, GENITAL  RPR   No results found.   1. Vaginal candidiasis   2. Diabetes mellitus, new onset   3. Hyperglycemia       MDM  Regarding the patient's vaginal itching. I suspect she has a yeast infection. Patient has tried Monistat. We'll give her a dose of Diflucan. I suspect this is related to her significant hyperglycemia and diabetes mellitus.  Regarding her new onset diabetes mellitus. Patient has profound hyperglycemia without evidence of diabetic ketoacidosis. Patient does not have a primary care doctor and she's not on any diabetes medications. With this degree of hyperglycemia and her lack of followup care we'll contact the hospitalist for admission.        Celene Kras, MD 10/21/11 [phone]

## 2011-10-21 NOTE — H&P (Signed)
NAMECHRISTYANNA, [name] NO.:  [id number]  MEDICAL RECORD NO.:  [id number]  LOCATION:  3740                         FACILITY:  [facility]  PHYSICIAN:  Talmage Nap, MD  DATE OF BIRTH:  Aug 28, 1966  DATE OF ADMISSION:  10/21/2011 DATE OF DISCHARGE:                             HISTORY & PHYSICAL   PRIMARY CARE PHYSICIAN:  Unassigned and history obtainable from the patient.  CHIEF COMPLAINT:  Polyuria, polydipsia and some vaginal itching of about 2-week duration.  HISTORY:  The patient is a 45 year old morbidly obese African American female with history of diabetes mellitus, has not seen a doctor in a long while, was transferred for [facility] [place] [facility] because of her polyuria and polydipsia which the patient has had on and off for about 2 weeks.  At the same time, the patient also had whitish vaginal discharge that was pleuritic.  The discharge was said to non offensive.  In addition, the patient also complained about painful micturition.  She denied any history of chest pain.  She denied any history of shortness of breath.  She denied any nausea or vomiting.  She claims she was feeling excessively thirsty and very dehydrated.  She denied any abdominal discomfort.  No diarrhea or hematochezia.  Symptoms were said to be getting progressively worse, and subsequently the patient presented herself to [facility].  At [facility], the patient was found to be dehydrated with a blood sugar level greater than 800 mg% and subsequently sent to [facility] for further evaluation and stabilization.  PAST MEDICAL HISTORY:  Positive for hypertension and morbid obesity.  PAST SURGICAL HISTORY: 1. Two cesarean sections 2. Appendectomy. 3. Left lumpectomy. 4. Hysterectomy.  MEDICATIONS:  Hydrochlorothiazide 25 mg 1 p.o. daily.  ALLERGIES:  To PENICILLIN.  SOCIAL HISTORY:  Negative for alcohol, tobacco use, and the patient works as a  Tree surgeon.  FAMILY HISTORY:  York Spaniel to be positive for diabetes mellitus.  REVIEW OF SYSTEMS:  The patient denies any history of headaches.  No blurred vision.  Complained about excessive thirst with nausea, but denied any vomiting.  She also complained about polydipsia.  She denied any chest pain.  She denied any shortness of breath.  She denies any cough.  Denied any abdominal discomfort.  No diarrhea or hematochezia. Complained of vaginal itching with whitish discharge, it is very itchy, non offensive.  No swelling of the lower extremities.  No intolerance to heat or cold and no neuropsychiatric disorder.  PHYSICAL EXAMINATION:  GENERAL:  Middle-aged lady, dehydrated, not in any obvious respiratory distress.  VITAL SIGNS:  At present, blood pressure is 146/90, pulse is 108, respiratory rate 19 temperature is 98.7. HEENT:  Pupils are reactive to light and extraocular muscles are intact. NECK:  No jugular venous distention.  No carotid bruit.  No lymphadenopathy. CHEST:  Clear to auscultation. HEART:  Sounds are 1 and 2. ABDOMEN:  Soft, obese and organs difficult to palpate.  Bowel sounds are positive. EXTREMITIES:  No pedal edema. NEUROLOGIC:  Nonfocal. MUSCULOSKELETAL:  Unremarkable. PELVIC EXAM:  Deferred. SKIN:  Moderately decreased turgor.  LABORATORY DATA:  Urinalysis showed urine glucose greater than 1000 mg%, ketones 50  mg/dL, nitrite and leukocyte esterase are negative.  Complete blood count with no differential showed WBC of 6.0, hemoglobin of 16.2, hematocrit of 44.3, MCV 83.3 with a platelet count of 265.  Lactic acid level is 2.8 and comprehensive metabolic panel showed sodium of 125, potassium of 3.6, chloride of 84 with a bicarb of 26, glucose is 857. LFTs showed total bilirubin 0.4, alkaline phosphatase 113, AST 30, ALT 40.  Wet prep showed yeast few with wbc moderate and then moderate bacteria seen.  IMPRESSION: 1. Nonketotic hyperosmolar hyperglycemia. 2.  Dehydration. 3. Vaginitis. 4. Morbid obesity. 5. Hypertension.  PLAN:  Admit the patient to telemetry.  The patient will be adequately rehydrated with normal saline to go at rate of 150 mL an hour.  She will continue glucose stabilizer as per protocol.  However, when blood sugar level is less than 250 mg%, the patient will be given Lantus insulin 25 units subcu stat, and 30 minutes thereafter we will discontinue IV insulin.  She will be on Accu-Cheks q.4 hourly with regular insulin sliding scale, and parameters are as follows:  150-200, 2 units; 201- 250, 4 units; 251-300, 6 units; 301-350, 8 units; 351-400, 10 units; and greater than 400, informed MD.  The patient will also be on Lantus insulin 25 units subcu at bedtime. For the vaginitis, the patient will be given Diflucan 150 mg p.o. stat, and subsequently will be given miconazole cream to be applied to the vulvar region b.i.d.  She will also empirically be given Rocephin 1 g IV q.24.  Blood pressure will be maintained with lisinopril 20 mg p.o. b.i.d. and Lopressor 50 mg p.o. b.i.d.  GI prophylaxis will be done with Protonix 40 mg IV q.24 and DVT prophylaxis with Lovenox 40 mg subcu q.24.  Further workup to be done on this patient will include hemoglobin A1c, lipid panel, urine culture and blood culture x2 before starting IV antibiotics.  Thyroid panel which include TSH, T3, and T4.  CBC, CMP, and magnesium will be repeated in a.m.  The patient will be followed and evaluated on day-to-day basis.     Talmage Nap, MD     CN/MEDQ  D:  10/21/2011  T:  10/21/2011  Job:  [id number]  Electronically Signed by Talmage Nap  on 10/21/2011 06:41:41 PM

## 2011-10-22 LAB — GLUCOSE, CAPILLARY
Glucose-Capillary: 300 mg/dL — ABNORMAL HIGH (ref 70–99)
Glucose-Capillary: 304 mg/dL — ABNORMAL HIGH (ref 70–99)

## 2011-10-22 LAB — DIFFERENTIAL
Lymphocytes Relative: 37 % (ref 12–46)
Lymphs Abs: 2.6 10*3/uL (ref 0.7–4.0)
Neutro Abs: 3.6 10*3/uL (ref 1.7–7.7)
Neutrophils Relative %: 52 % (ref 43–77)

## 2011-10-22 LAB — COMPREHENSIVE METABOLIC PANEL
AST: 21 U/L (ref 0–37)
Alkaline Phosphatase: 72 U/L (ref 39–117)
BUN: 7 mg/dL (ref 6–23)
CO2: 25 mEq/L (ref 19–32)
Chloride: 98 mEq/L (ref 96–112)
Creatinine, Ser: 0.73 mg/dL (ref 0.50–1.10)
GFR calc non Af Amer: >90 mL/min (ref >90)
Potassium: 3.1 mEq/L — ABNORMAL LOW (ref 3.5–5.1)
Total Bilirubin: 0.3 mg/dL (ref 0.3–1.2)

## 2011-10-22 LAB — CBC
HCT: 41.4 % (ref 36.0–46.0)
MCV: 85.4 fL (ref 78.0–100.0)
RBC: 4.85 MIL/uL (ref 3.87–5.11)
WBC: 6.9 10*3/uL (ref 4.0–10.5)

## 2011-10-22 LAB — MAGNESIUM: Magnesium: 1.7 mg/dL (ref 1.5–2.5)

## 2011-10-23 LAB — BASIC METABOLIC PANEL
BUN: 6 mg/dL (ref 6–23)
Calcium: 9 mg/dL (ref 8.4–10.5)
GFR calc non Af Amer: >90 mL/min (ref >90)
Glucose, Bld: 291 mg/dL — ABNORMAL HIGH (ref 70–99)

## 2011-10-27 LAB — CULTURE, BLOOD (ROUTINE X 2): Culture: NO GROWTH

## 2011-10-27 NOTE — Discharge Summary (Signed)
[name], Alicia NO.:  [id number]  MEDICAL RECORD NO.:  [id number]  LOCATION:  3740                         FACILITY:  [facility]  PHYSICIAN:  Thad Ranger, MD       DATE OF BIRTH:  1966/03/15  DATE OF ADMISSION:  10/21/2011 DATE OF DISCHARGE:  10/23/2011                        DISCHARGE SUMMARY - REFERRING   PRIMARY CARE PHYSICIAN:  Now assigned, [facility], previously none at the time of admission.  FINAL DISCHARGE DIAGNOSES: 1. Nonketotic hyperosmolar hyperglycemia. 2. Diabetes mellitus, type 2, uncontrolled. 3. Dehydration. 4. Vaginitis. 5. Morbid opacity. 6. Hypertension. 7. Pseudohyponatremia, likely secondary to hyperosmolar hyperglycemia. 8. Hypokalemia, replaced.  FINAL DISCHARGE MEDICATIONS: 1. Lisinopril 20 mg p.o. b.i.d. 2. Metoprolol 50 mg p.o. b.i.d. 3. Zolpidem 5 mg p.o. nightly. 4. Monistat vaginal application q.i.d. 5. Lantus 40 units subcu nightly. 6. Amaryl 4 mg p.o. q.a.m. 7. Aspart 1-15 units t.i.d. a.c. with sliding scale moderate.  BRIEF HISTORY OF PRESENT ILLNESS:  At the time of admission, [name] is a 45 year old female, morbidly obese with a history of diabetes mellitus, had not seen a doctor in a long while, was transferred from [facility] because of polyuria and polydipsia off and on for 2 weeks.  The patient also complained of some whitish vaginal discharge, nonoffensive, and painful micturition.  She denied any chest pain.  The patient's symptoms got progressively worse at [facility], and she was transferred to [facility]. At the time of admission, she was found to be dehydrated with a blood sugar of more than 100.  Radiological data none.  PERTINENT LABORATORY/DIAGNOSTIC DATA:  At the time of admission, UA showed more than 1000 glucose, 15 ketones, negative for UTI.  CBC showed hemoglobin of 16.2 with hematocrit of 44.3, lactic acid 2.8.  CMP showed sodium of 125, potassium of 3.6,  glucose 857, BUN 8, creatinine 0.7, ALT 40, total protein 8.7.  TSH 2.5.  HB A1c 10.9 with a mean plasma glucose of 266.  BMET at the time of discharge, sodium 137, glucose 291.  BRIEF HOSPITALIZATION COURSE:  [name] is a 45 year old female, who came in with a hyperosmolar hyperglycemia. 1. Hyperosmolar hyperglycemia/diabetes, type 2.  The patient was     admitted to the telemonitored floor.  She was placed on sliding-     scale insulin and Lantus.  HB A1c was showing to be extremely high     at 10.9.  The patient was eventually discharged on Lantus and     sliding scale insulin with Amaryl.  She was counseled on compliance     and carb-modified diet and weight control. 2. Hypertension.  The patient was also started on lisinopril and beta-     blocker. 3. Vaginitis.  The patient was provided with 1 dose of Diflucan and     miconazole cream.  PHYSICAL EPHYSICAL EXAMINATION:  VITAL SIGNS:  At the time of the discharge, temperature 97.6, pulse 76, respirations 19, blood pressure 137/63. GENERAL:  The patient is alert, awake, and oriented x3, not in any acute distress. HEENT:  Anicteric sclerae.  Pink conjunctivae.  Pupils are reactive to light and accommodation, EOMI. NECK:  Supple.  No lymphadenopathy.  No JVD. CVS:  S1, S2 clear.  Regular rate and rhythm. CHEST:  Clear to auscultation bilaterally. ABDOMEN:  Soft, nontender, nondistended.  Normal bowel sounds. EXTREMITIES:  No cyanosis, clubbing, or edema noted in the upper or lower extremities bilaterally.  The patient was discharged home in good and stable condition.  DISCHARGE FOLLOWUP:  At [facility], the discharge instructions were written on discharge instruction sheet.  DISCHARGE TIME:  Thirty-five minutes.     Thad Ranger, MD     RR/MEDQ  D:  10/23/2011  T:  10/23/2011  Job:  [id number]  cc:   [facility]  Electronically Signed by Thaddius Manes  on 10/27/2011 04:45:50 PM

## 2011-11-13 ENCOUNTER — Encounter (HOSPITAL_BASED_OUTPATIENT_CLINIC_OR_DEPARTMENT_OTHER): Payer: Self-pay | Admitting: *Deleted

## 2011-11-13 ENCOUNTER — Emergency Department (HOSPITAL_BASED_OUTPATIENT_CLINIC_OR_DEPARTMENT_OTHER)
Admission: EM | Admit: 2011-11-13 | Discharge: 2011-11-13 | Disposition: A | Discharge status: Home / Self Care | Payer: Medicaid Other | Attending: Emergency Medicine | Admitting: Emergency Medicine

## 2011-11-13 DIAGNOSIS — E119 Type 2 diabetes mellitus without complications: Secondary | ICD-10-CM | POA: Insufficient documentation

## 2011-11-13 DIAGNOSIS — I10 Essential (primary) hypertension: Secondary | ICD-10-CM | POA: Insufficient documentation

## 2011-11-13 DIAGNOSIS — A499 Bacterial infection, unspecified: Secondary | ICD-10-CM | POA: Insufficient documentation

## 2011-11-13 DIAGNOSIS — N760 Acute vaginitis: Secondary | ICD-10-CM | POA: Insufficient documentation

## 2011-11-13 DIAGNOSIS — B9689 Other specified bacterial agents as the cause of diseases classified elsewhere: Secondary | ICD-10-CM | POA: Insufficient documentation

## 2011-11-13 DIAGNOSIS — Z79899 Other long term (current) drug therapy: Secondary | ICD-10-CM | POA: Insufficient documentation

## 2011-11-13 DIAGNOSIS — N9089 Other specified noninflammatory disorders of vulva and perineum: Secondary | ICD-10-CM | POA: Insufficient documentation

## 2011-11-13 LAB — URINALYSIS, ROUTINE W REFLEX MICROSCOPIC
Ketones, ur: NEGATIVE mg/dL
Nitrite: NEGATIVE
Urobilinogen, UA: 1 mg/dL (ref 0.0–1.0)
pH: 6.5 (ref 5.0–8.0)

## 2011-11-13 LAB — CBC
Platelets: 233 10*3/uL (ref 150–400)
RDW: 13.8 % (ref 11.5–15.5)
WBC: 7.3 10*3/uL (ref 4.0–10.5)

## 2011-11-13 LAB — DIFFERENTIAL
Basophils Absolute: 0 10*3/uL (ref 0.0–0.1)
Eosinophils Relative: 3 % (ref 0–5)
Lymphocytes Relative: 45 % (ref 12–46)
Neutro Abs: 3.1 10*3/uL (ref 1.7–7.7)

## 2011-11-13 LAB — BASIC METABOLIC PANEL
Chloride: 105 mEq/L (ref 96–112)
Creatinine, Ser: 0.7 mg/dL (ref 0.50–1.10)
GFR calc Af Amer: >90 mL/min (ref >90)
GFR calc non Af Amer: >90 mL/min (ref >90)
Potassium: 3.8 mEq/L (ref 3.5–5.1)

## 2011-11-13 LAB — WET PREP, GENITAL
Trich, Wet Prep: NONE SEEN
WBC, Wet Prep HPF POC: NONE SEEN
Yeast Wet Prep HPF POC: NONE SEEN

## 2011-11-13 LAB — URINE MICROSCOPIC-ADD ON

## 2011-11-13 MED ORDER — DIPHENHYDRAMINE HCL 50 MG/ML IJ SOLN
25.0000 mg | Freq: Once | INTRAMUSCULAR | Status: AC
  Administered 2011-11-13: 25 mg via INTRAVENOUS

## 2011-11-13 MED ORDER — KETOROLAC TROMETHAMINE 30 MG/ML IJ SOLN
30.0000 mg | Freq: Once | INTRAMUSCULAR | Status: AC
  Administered 2011-11-13: 30 mg via INTRAVENOUS
  Filled 2011-11-13: qty 1

## 2011-11-13 MED ORDER — DIPHENHYDRAMINE HCL 50 MG/ML IJ SOLN
INTRAMUSCULAR | Status: AC
  Filled 2011-11-13: qty 1

## 2011-11-13 MED ORDER — DIPHENHYDRAMINE HCL 25 MG PO CAPS
25.0000 mg | ORAL_CAPSULE | Freq: Once | ORAL | Status: DC
  Filled 2011-11-13: qty 1

## 2011-11-13 MED ORDER — METRONIDAZOLE 500 MG PO TABS: 500.0000 mg | ORAL_TABLET | Freq: Two times a day (BID) | ORAL | Status: AC

## 2011-11-13 MED ORDER — METOCLOPRAMIDE HCL 5 MG/ML IJ SOLN
10.0000 mg | Freq: Once | INTRAMUSCULAR | Status: AC
  Administered 2011-11-13: 10 mg via INTRAVENOUS
  Filled 2011-11-13: qty 2

## 2011-11-13 MED ORDER — SODIUM CHLORIDE 0.9 % IV SOLN
INTRAVENOUS | Status: DC
  Administered 2011-11-13: 20:00:00 via INTRAVENOUS

## 2011-11-13 NOTE — ED Notes (Signed)
Pt states she has had H/A off and on since she was discharged from [facility].

## 2011-11-13 NOTE — ED Provider Notes (Signed)
History  Scribed for Carleene Cooper III, MD, the patient was seen in MH01/[facility]. The chart was scribed by Gilman Schmidt. The patients care was started at 6:49 PM.   CSN: [id number] Arrival date & time: 11/13/2011  5:54 PM   First MD Initiated Contact with Patient 11/13/11 1845      Chief Complaint  Patient presents with  . Migraine    Patient is a 45 y.o. female presenting with migraine.  Migraine Associated symptoms include headaches.    [name] is a 45 y.o. female with a history of HTN and DM who presents to the Emergency Department complaining of throbbing frontal migraine. Pt states she has had headache off and on since she was discharged from [facility] three weeks prior with elevated blood sugar levels. Associated symptoms of ear pain, sore throat, loose stool, photophobia, cough, and numbness in left leg. Denies any fever, chest pain, d/v, rash, or urinary symptoms. Pt is also concerned about white vaginal cyst. Pt noted cyst in recent [facility] visits and was offered Rx with no relief. There are no other associated symptoms and no other alleviating or aggravating factors.    Past Medical History  Diagnosis Date  . Hypertension     Past Surgical History  Procedure Date  . Breast lumpectomy   . Appendectomy   . Abdominal hysterectomy   . Cesarean section     History reviewed. No pertinent family history.  History  Substance Use Topics  . Smoking status: Current Some Day Smoker  . Smokeless tobacco: Not on file  . Alcohol Use: No    OB History    Grav Para Term Preterm Abortions TAB SAB Ect Mult Living                  Review of Systems  Constitutional: Negative for fever.  HENT: Positive for ear pain and sore throat.   Eyes: Positive for photophobia.  Respiratory: Positive for cough.   Gastrointestinal: Negative for vomiting.  Genitourinary: Negative for dysuria, urgency, hematuria, decreased urine volume and difficulty urinating.  Skin: Negative for  rash.  Neurological: Positive for headaches. Negative for seizures and syncope.  All other systems reviewed and are negative.    Allergies  Penicillins  Home Medications   Current Outpatient Rx  Name Route Sig Dispense Refill  . CLEMASTINE FUMARATE 1.34 MG PO TABS Oral Take 1 tablet by mouth at bedtime as needed. For runny nose     . GLIMEPIRIDE 4 MG PO TABS Oral Take 4 mg by mouth every morning.      . INSULIN ASPART 100 UNIT/ML [place] SOLN Subcutaneous Inject 2-15 Units into the skin 3 (three) times daily before meals. According to sliding scale if blood sugar is 121-150: 2 units, 151-200: 3 units, 201-250: 5 units, 251-300: 8 units, 301-350: 11 units, 351-400: 15 units, if greater than 400, 15 units and contact PCP     . INSULIN GLARGINE 100 UNIT/ML  SOLN Subcutaneous Inject 40 Units into the skin at bedtime.      Marland Kitchen LISINOPRIL 20 MG PO TABS Oral Take 20 mg by mouth 2 (two) times daily.      Marland Kitchen METOPROLOL TARTRATE 50 MG PO TABS Oral Take 50 mg by mouth 2 (two) times daily.      Marland Kitchen ZOLPIDEM TARTRATE 5 MG PO TABS Oral Take 5 mg by mouth at bedtime as needed. For sleep        BP 158/85  Pulse 64  Temp(Src)  98 F (36.7 C) (Oral)  Resp 20  Ht 5\' 8"  (1.727 m)  Wt 335 lb (151.955 kg)  BMI 50.94 kg/m2  SpO2 100%  Physical Exam  Constitutional: She is oriented to person, place, and time. She appears well-developed and well-nourished.  Non-toxic appearance. She does not have a sickly appearance.       Morbidly obese  HENT:  Head: Normocephalic and atraumatic.       No point tenderness over skull  Eyes: Conjunctivae, EOM and lids are normal. Pupils are equal, round, and reactive to light. No scleral icterus.  Neck: Trachea normal and normal range of motion. Neck supple.  Cardiovascular: Regular rhythm and normal heart sounds.   Pulmonary/Chest: Effort normal and breath sounds normal.  Abdominal: Soft. Normal appearance. There is no tenderness. There is no rebound, no guarding and no CVA  tenderness.  Genitourinary:       Left labia minora 1cm white papilloma growth on short stalk Minimal vaginal discharge Bimanual exam limited by massive obesity  Musculoskeletal: Normal range of motion.       No calf tendernes  Neurological: She is alert and oriented to person, place, and time. She has normal strength.  Skin: Skin is warm, dry and intact. No rash noted.    ED Course  Procedures  DIAGNOSTIC STUDIES: Oxygen Saturation is 100% on room air, normal by my interpretation.    COORDINATION OF CARE: 7:00pm:  - Patient evaluated by ED physician, Benadryl, Reglan, Toradol, BMP, UA, Urine culture, GC/chlamydia, Wet prep, CBC, Diff, Glucose cap ordered 9:41 PM: Bacterial Vaginosis. [facility] is called and they will contact her for evaluation.   LABS Results for orders placed during the hospital encounter of 11/13/11  GLUCOSE, CAPILLARY      Component Value Range   Glucose-Capillary 91  70 - 99 (mg/dL)  CBC      Component Value Range   WBC PENDING  4.0 - 10.5 (K/uL)   RBC 4.99  3.87 - 5.11 (MIL/uL)   Hemoglobin 15.0  12.0 - 15.0 (g/dL)   HCT 16.1  09.6 - 04.5 (%)   MCV 87.4  78.0 - 100.0 (fL)   MCH 30.1  26.0 - 34.0 (pg)   MCHC 34.4  30.0 - 36.0 (g/dL)   RDW 40.9  81.1 - 91.4 (%)   Platelets PENDING  150 - 400 (K/uL)  DIFFERENTIAL      Component Value Range   Neutrophils Relative PENDING  43 - 77 (%)   Neutro Abs PENDING  1.7 - 7.7 (K/uL)   Band Neutrophils PENDING  0 - 10 (%)   Lymphocytes Relative PENDING  12 - 46 (%)   Lymphs Abs PENDING  0.7 - 4.0 (K/uL)   Monocytes Relative PENDING  3 - 12 (%)   Monocytes Absolute PENDING  0.1 - 1.0 (K/uL)   Eosinophils Relative PENDING  0 - 5 (%)   Eosinophils Absolute PENDING  0.0 - 0.7 (K/uL)   Basophils Relative PENDING  0 - 1 (%)   Basophils Absolute PENDING  0.0 - 0.1 (K/uL)   WBC Morphology PENDING     RBC Morphology PENDING     Smear Review PENDING     nRBC PENDING  0 (/100 WBC)   Metamyelocytes Relative  PENDING     Myelocytes PENDING     Promyelocytes Absolute PENDING     Blasts PENDING    BASIC METABOLIC PANEL      Component Value Range   Sodium 140  135 -  145 (mEq/L)   Potassium 3.8  3.5 - 5.1 (mEq/L)   Chloride 105  96 - 112 (mEq/L)   CO2 25  19 - 32 (mEq/L)   Glucose, Bld 81  70 - 99 (mg/dL)   BUN 14  6 - 23 (mg/dL)   Creatinine, Ser 1.61  0.50 - 1.10 (mg/dL)   Calcium 9.9  8.4 - 09.6 (mg/dL)   GFR calc non Af Amer >90  >90 (mL/min)   GFR calc Af Amer >90  >90 (mL/min)  URINALYSIS, ROUTINE W REFLEX MICROSCOPIC      Component Value Range   Color, Urine YELLOW  YELLOW    Appearance CLEAR  CLEAR    Specific Gravity, Urine 1.014  1.005 - 1.030    pH 6.5  5.0 - 8.0    Glucose, UA NEGATIVE  NEGATIVE (mg/dL)   Hgb urine dipstick NEGATIVE  NEGATIVE    Bilirubin Urine NEGATIVE  NEGATIVE    Ketones, ur NEGATIVE  NEGATIVE (mg/dL)   Protein, ur NEGATIVE  NEGATIVE (mg/dL)   Urobilinogen, UA 1.0  0.0 - 1.0 (mg/dL)   Nitrite NEGATIVE  NEGATIVE    Leukocytes, UA TRACE (*) NEGATIVE   URINE MICROSCOPIC-ADD ON      Component Value Range   Squamous Epithelial / LPF FEW (*) RARE    WBC, UA 0-2  <3 (WBC/hpf)   Bacteria, UA FEW (*) RARE    Results for orders placed during the hospital encounter of 11/13/11  GLUCOSE, CAPILLARY      Component Value Range   Glucose-Capillary 91  70 - 99 (mg/dL)  WET PREP, GENITAL      Component Value Range   Yeast, Wet Prep NONE SEEN  NONE SEEN    Trich, Wet Prep NONE SEEN  NONE SEEN    Clue Cells, Wet Prep MANY (*) NONE SEEN    WBC, Wet Prep HPF POC NONE SEEN  NONE SEEN   CBC      Component Value Range   WBC 7.3  4.0 - 10.5 (K/uL)   RBC 4.99  3.87 - 5.11 (MIL/uL)   Hemoglobin 15.0  12.0 - 15.0 (g/dL)   HCT 04.5  40.9 - 81.1 (%)   MCV 87.4  78.0 - 100.0 (fL)   MCH 30.1  26.0 - 34.0 (pg)   MCHC 34.4  30.0 - 36.0 (g/dL)   RDW 91.4  78.2 - 95.6 (%)   Platelets 233  150 - 400 (K/uL)  DIFFERENTIAL      Component Value Range   Neutrophils Relative 43   43 - 77 (%)   Neutro Abs 3.1  1.7 - 7.7 (K/uL)   Lymphocytes Relative 45  12 - 46 (%)   Lymphs Abs 3.3  0.7 - 4.0 (K/uL)   Monocytes Relative 8  3 - 12 (%)   Monocytes Absolute 0.6  0.1 - 1.0 (K/uL)   Eosinophils Relative 3  0 - 5 (%)   Eosinophils Absolute 0.2  0.0 - 0.7 (K/uL)   Basophils Relative 0  0 - 1 (%)   Basophils Absolute 0.0  0.0 - 0.1 (K/uL)   WBC Morphology ATYPICAL LYMPHOCYTES    BASIC METABOLIC PANEL      Component Value Range   Sodium 140  135 - 145 (mEq/L)   Potassium 3.8  3.5 - 5.1 (mEq/L)   Chloride 105  96 - 112 (mEq/L)   CO2 25  19 - 32 (mEq/L)   Glucose, Bld 81  70 - 99 (mg/dL)  BUN 14  6 - 23 (mg/dL)   Creatinine, Ser 1.61  0.50 - 1.10 (mg/dL)   Calcium 9.9  8.4 - 09.6 (mg/dL)   GFR calc non Af Amer >90  >90 (mL/min)   GFR calc Af Amer >90  >90 (mL/min)  URINALYSIS, ROUTINE W REFLEX MICROSCOPIC      Component Value Range   Color, Urine YELLOW  YELLOW    Appearance CLEAR  CLEAR    Specific Gravity, Urine 1.014  1.005 - 1.030    pH 6.5  5.0 - 8.0    Glucose, UA NEGATIVE  NEGATIVE (mg/dL)   Hgb urine dipstick NEGATIVE  NEGATIVE    Bilirubin Urine NEGATIVE  NEGATIVE    Ketones, ur NEGATIVE  NEGATIVE (mg/dL)   Protein, ur NEGATIVE  NEGATIVE (mg/dL)   Urobilinogen, UA 1.0  0.0 - 1.0 (mg/dL)   Nitrite NEGATIVE  NEGATIVE    Leukocytes, UA TRACE (*) NEGATIVE   URINE MICROSCOPIC-ADD ON      Component Value Range   Squamous Epithelial / LPF FEW (*) RARE    WBC, UA 0-2  <3 (WBC/hpf)   Bacteria, UA FEW (*) RARE    Course in ED: Pt was treated for headache with Reglan, Benadryl, and Toradol, with relief.  Pelvic exam showed a white mass appx 1/2 cm diameter on a stalk.  I contacted [facility]: their clinic will call pt to be seen for this.  She had a vaginal discharge that turned out to be caused by bacterial vaginosis; Rx with metronidazole 500 mg bid x 7 days.    I personally performed the services described in this documentation, which was scribed in  my presence. The recorded information has been reviewed and considered.  Osvaldo Human, M.D.      Carleene Cooper III, MD 11/14/11 [phone]

## 2011-11-15 LAB — URINE CULTURE: Culture  Setup Time: 201211180136

## 2011-11-16 NOTE — ED Notes (Signed)
+   Urine Chart sent to EDP office for review. 

## 2011-11-17 NOTE — ED Notes (Signed)
Not large enough # of colonies to warrant treatment. Written by  Rob Hickman.

## 2011-11-21 ENCOUNTER — Inpatient Hospital Stay (HOSPITAL_COMMUNITY)
Admission: EM | Admit: 2011-11-21 | Discharge: 2011-11-21 | Disposition: A | Discharge status: Home / Self Care | Payer: Self-pay | Attending: Obstetrics & Gynecology | Admitting: Obstetrics & Gynecology

## 2011-11-21 DIAGNOSIS — R059 Cough, unspecified: Secondary | ICD-10-CM | POA: Insufficient documentation

## 2011-11-21 DIAGNOSIS — Z79899 Other long term (current) drug therapy: Secondary | ICD-10-CM | POA: Insufficient documentation

## 2011-11-21 DIAGNOSIS — IMO0001 Reserved for inherently not codable concepts without codable children: Secondary | ICD-10-CM | POA: Insufficient documentation

## 2011-11-21 DIAGNOSIS — E119 Type 2 diabetes mellitus without complications: Secondary | ICD-10-CM | POA: Insufficient documentation

## 2011-11-21 DIAGNOSIS — R05 Cough: Secondary | ICD-10-CM | POA: Insufficient documentation

## 2011-11-21 DIAGNOSIS — R229 Localized swelling, mass and lump, unspecified: Secondary | ICD-10-CM | POA: Insufficient documentation

## 2011-11-21 DIAGNOSIS — I10 Essential (primary) hypertension: Secondary | ICD-10-CM | POA: Insufficient documentation

## 2011-11-22 ENCOUNTER — Encounter (HOSPITAL_COMMUNITY): Payer: Self-pay | Admitting: *Deleted

## 2011-11-22 ENCOUNTER — Emergency Department (HOSPITAL_COMMUNITY)
Admission: EM | Admit: 2011-11-22 | Discharge: 2011-11-22 | Disposition: A | Discharge status: Home / Self Care | Payer: Self-pay | Attending: Emergency Medicine | Admitting: Emergency Medicine

## 2011-11-22 ENCOUNTER — Emergency Department (HOSPITAL_COMMUNITY): Payer: Self-pay

## 2011-11-22 DIAGNOSIS — R05 Cough: Secondary | ICD-10-CM

## 2011-11-22 DIAGNOSIS — M791 Myalgia, unspecified site: Secondary | ICD-10-CM

## 2011-11-22 DIAGNOSIS — D492 Neoplasm of unspecified behavior of bone, soft tissue, and skin: Secondary | ICD-10-CM

## 2011-11-22 MED ORDER — HYDROCOD POLST-CHLORPHEN POLST 10-8 MG/5ML PO LQCR
5.0000 mL | Freq: Once | ORAL | Status: AC
  Administered 2011-11-22: 5 mL via ORAL
  Filled 2011-11-22: qty 5

## 2011-11-22 MED ORDER — HYDROCOD POLST-CHLORPHEN POLST 10-8 MG/5ML PO LQCR: 5.0000 mL | Freq: Two times a day (BID) | ORAL | Status: DC

## 2011-11-22 NOTE — ED Provider Notes (Signed)
Medical screening examination/treatment/procedure(s) were performed by non-physician practitioner and as supervising physician I was immediately available for consultation/collaboration.  Nyzir Dubois P Margrette Wynia, MD 11/22/11 2303 

## 2011-11-22 NOTE — ED Notes (Signed)
Pt states that she began hurting "all over" on Saturday night.  Pt was seen at [facility] urgent care on 11/15/20 for a vaginal growth and was referred to [facility] for follow up.  Pt has not followed up.  Pt is tearful and stating that she has pain all over her body.

## 2011-11-22 NOTE — ED Provider Notes (Signed)
History     CSN: [id number] Arrival date & time: 11/22/2011 12:20 AM   First MD Initiated Contact with Patient 11/22/11 0204      Chief Complaint  Patient presents with  . Muscle Pain  . Groin Swelling    "I have a growth on my vagina"    HPI  History provided by the patient. Patient with past medical history of diabetes, presents with complaints of 2 days cough and generalized bodyaches. Symptoms came on acutely. Patient states cough is a dry cough and nonproductive. She reports having subjective fevers and chills at home and 2 episodes of vomiting earlier today. Patient states that she tried taking some tramadol at home but this did not help any of her symptoms. Patient denies any chest pain or abdominal pain. She has no diarrhea or constipation. In addition patient also mentions having pain from a growth on her genital area. She states that she has had this for the past month and was seen for the same complaints at the urgent care Center a few days ago. She states that she was referred to the [facility] and OB/GYN service but she has not followed up yet. He states that the area continues to cause some pain and discomfort.   Past Medical History  Diagnosis Date  . Hypertension   . Diabetes mellitus     Past Surgical History  Procedure Date  . Breast lumpectomy   . Appendectomy   . Abdominal hysterectomy   . Cesarean section     History reviewed. No pertinent family history.  History  Substance Use Topics  . Smoking status: Current Some Day Smoker  . Smokeless tobacco: Not on file  . Alcohol Use: No    OB History    Grav Para Term Preterm Abortions TAB SAB Ect Mult Living                  Review of Systems  Constitutional: Positive for fever, chills and fatigue.  HENT: Positive for congestion. Negative for sore throat.   Respiratory: Positive for cough.   Cardiovascular: Negative for chest pain.  Gastrointestinal: Negative for nausea, vomiting, diarrhea  and constipation.  Musculoskeletal: Positive for myalgias.  All other systems reviewed and are negative.    Allergies  Penicillins  Home Medications   Current Outpatient Rx  Name Route Sig Dispense Refill  . CLEMASTINE FUMARATE 1.34 MG PO TABS Oral Take 1 tablet by mouth at bedtime as needed. For runny nose     . GLIMEPIRIDE 4 MG PO TABS Oral Take 4 mg by mouth every morning.      . INSULIN ASPART 100 UNIT/ML [place] SOLN Subcutaneous Inject 2-15 Units into the skin 3 (three) times daily before meals. According to sliding scale if blood sugar is 121-150: 2 units, 151-200: 3 units, 201-250: 5 units, 251-300: 8 units, 301-350: 11 units, 351-400: 15 units, if greater than 400, 15 units and contact PCP     . INSULIN GLARGINE 100 UNIT/ML [place] SOLN Subcutaneous Inject 40 Units into the skin at bedtime.      Marland Kitchen LISINOPRIL 20 MG PO TABS Oral Take 20 mg by mouth 2 (two) times daily.      Marland Kitchen METOPROLOL TARTRATE 50 MG PO TABS Oral Take 50 mg by mouth 2 (two) times daily.      Marland Kitchen METRONIDAZOLE 500 MG PO TABS Oral Take 1 tablet (500 mg total) by mouth 2 (two) times daily. 14 tablet 0  . ZOLPIDEM TARTRATE 5  MG PO TABS Oral Take 5 mg by mouth at bedtime as needed. For sleep        BP 132/88  Pulse 94  Temp(Src) 98.8 F (37.1 C) (Oral)  Resp 18  SpO2 96%  Physical Exam  Nursing note and vitals reviewed. Constitutional: She is oriented to person, place, and time. She appears well-developed and well-nourished. No distress.  HENT:  Head: Normocephalic.  Right Ear: External ear normal.  Left Ear: External ear normal.  Mouth/Throat: Oropharynx is clear and moist.  Eyes: Conjunctivae and EOM are normal. Pupils are equal, round, and reactive to light.  Cardiovascular: Normal rate, regular rhythm and normal heart sounds.   Pulmonary/Chest: Effort normal. No respiratory distress. She has no wheezes. She has no rales.  Abdominal: Soft. There is no tenderness. There is no rebound and no guarding.    Genitourinary: No vaginal discharge found.       6 mm pedunculated and nodular lesion over anterior left labia majora. Color is hypopigmented with some violaceous discolorations. Lesion does not appear consistent with a genital wart.  Neurological: She is alert and oriented to person, place, and time.  Skin: Skin is warm. No rash noted.  Psychiatric: She has a normal mood and affect.    ED Course  Procedures (including critical care time)  Labs Reviewed - No data to display  Dg Chest 2 View  11/22/2011  *RADIOLOGY REPORT*  Clinical Data: Cough and body aches for 1 week.  CHEST - 2 VIEW  Comparison: Chest radiograph performed 04/18 1011  Findings: The lungs are well-aerated and clear.  There is no evidence of focal opacification, pleural effusion or pneumothorax.  The heart is borderline normal in size; the mediastinal contour is within normal limits.  No acute osseous abnormalities are seen.  IMPRESSION: No acute cardiopulmonary process seen.  Original Report Authenticated By: Tonia Ghent, M.D.     1. Cough   2. Myalgia   3. Skin growth      MDM  2:00 AM patient seen and evaluated. Patient in no acute distress.        Angus Seller, [place] 11/22/11 [phone]

## 2011-11-22 NOTE — ED Notes (Signed)
Patient transported to X-ray 

## 2011-11-22 NOTE — ED Notes (Signed)
MD at bedside. 

## 2011-11-22 NOTE — ED Notes (Signed)
Patient is resting comfortably. 

## 2011-11-24 ENCOUNTER — Other Ambulatory Visit (HOSPITAL_COMMUNITY)
Admission: RE | Admit: 2011-11-24 | Discharge: 2011-11-24 | Disposition: A | Discharge status: Home / Self Care | Payer: Self-pay | Source: Ambulatory Visit | Attending: Obstetrics and Gynecology | Admitting: Obstetrics and Gynecology

## 2011-11-24 ENCOUNTER — Ambulatory Visit (INDEPENDENT_AMBULATORY_CARE_PROVIDER_SITE_OTHER): Payer: Self-pay | Admitting: Obstetrics and Gynecology

## 2011-11-24 ENCOUNTER — Encounter: Payer: Self-pay | Admitting: Obstetrics and Gynecology

## 2011-11-24 DIAGNOSIS — N9489 Other specified conditions associated with female genital organs and menstrual cycle: Secondary | ICD-10-CM

## 2011-11-24 DIAGNOSIS — L980 Pyogenic granuloma: Secondary | ICD-10-CM | POA: Insufficient documentation

## 2011-11-24 DIAGNOSIS — N9089 Other specified noninflammatory disorders of vulva and perineum: Secondary | ICD-10-CM

## 2011-11-24 DIAGNOSIS — E119 Type 2 diabetes mellitus without complications: Secondary | ICD-10-CM

## 2011-11-24 DIAGNOSIS — I10 Essential (primary) hypertension: Secondary | ICD-10-CM | POA: Insufficient documentation

## 2011-11-24 DIAGNOSIS — E669 Obesity, unspecified: Secondary | ICD-10-CM

## 2011-11-24 LAB — GLUCOSE, CAPILLARY: Glucose-Capillary: 97 mg/dL (ref 70–99)

## 2011-11-24 NOTE — Progress Notes (Signed)
45 yo G2P2002 presenting today for evaluation of left labial lesion.  Past Medical History  Diagnosis Date  . Hypertension   . Diabetes mellitus    Past Surgical History  Procedure Date  . Breast lumpectomy   . Appendectomy   . Abdominal hysterectomy   . Cesarean section    Family History  Problem Relation Age of Onset  . Hypertension Father   . Stroke Father   . Cancer Sister 54    breast  . Diabetes Brother   . Cancer Paternal Aunt     breast  . Diabetes Paternal Aunt   . Cancer Maternal Grandmother     breast   History  Substance Use Topics  . Smoking status: Never Smoker   . Smokeless tobacco: Never Used  . Alcohol Use: No   GENERAL: Well-developed, well-nourished female in no acute distress. obese PELVIC: Normal external female genitalia with a 1.5-2 cm pedunculated mass on lower aspect of left labia majora. Lesion is firm and multicolored (pink, red and white). EXTREMITIES: No cyanosis, clubbing, or edema, 2+ distal pulses.  A/P 45 yo G2P2 with left labia mass - After informed consent was obtained and following administration of lidocaine, the mass was removed. Hemostatsis was achieved my placing a figure of eight suture using Vicryl.  - Patient tolerated the procedure well and is to return in 2 weeks to discuss the results of the pathology.

## 2011-11-25 ENCOUNTER — Telehealth (HOSPITAL_BASED_OUTPATIENT_CLINIC_OR_DEPARTMENT_OTHER): Payer: Self-pay | Admitting: Emergency Medicine

## 2011-12-15 ENCOUNTER — Telehealth: Payer: Self-pay | Admitting: *Deleted

## 2011-12-15 NOTE — Telephone Encounter (Signed)
Called patient and left a message we are returning your call, if you still have a question- call clinic again

## 2011-12-15 NOTE — Telephone Encounter (Signed)
Pt called wanting results. Pt did not state what results.

## 2011-12-16 ENCOUNTER — Other Ambulatory Visit: Payer: Self-pay | Admitting: Family

## 2011-12-16 ENCOUNTER — Telehealth: Payer: Self-pay | Admitting: *Deleted

## 2011-12-16 MED ORDER — NYSTATIN 100000 UNIT/GM EX CREA: TOPICAL_CREAM | Freq: Two times a day (BID) | CUTANEOUS | Status: DC

## 2011-12-16 NOTE — Telephone Encounter (Signed)
Called pt and left message that this is our second attempt on returning her call and if she has any questions or concerns to please give [place] call at the clinics.

## 2011-12-16 NOTE — Telephone Encounter (Signed)
Patient called and left message that she had called a few days ago and someone called her back , but she missed the call. Called patient and she requested result from last visit- per chart review patient had labial biopsy- which was negative for malignancy- informed patient of that result  And that it showed pyogenic granuloma- patient has results appointment 12/22/11- informed patient I would route this to her and ask if patient needs to keep that- if provider cancels it - we would call her back. Patient voices understanding.

## 2011-12-17 ENCOUNTER — Encounter (HOSPITAL_BASED_OUTPATIENT_CLINIC_OR_DEPARTMENT_OTHER): Payer: Self-pay | Admitting: *Deleted

## 2011-12-17 ENCOUNTER — Emergency Department (HOSPITAL_BASED_OUTPATIENT_CLINIC_OR_DEPARTMENT_OTHER)
Admission: EM | Admit: 2011-12-17 | Discharge: 2011-12-17 | Disposition: A | Discharge status: Home / Self Care | Payer: Self-pay | Attending: Emergency Medicine | Admitting: Emergency Medicine

## 2011-12-17 DIAGNOSIS — E162 Hypoglycemia, unspecified: Secondary | ICD-10-CM

## 2011-12-17 DIAGNOSIS — K089 Disorder of teeth and supporting structures, unspecified: Secondary | ICD-10-CM | POA: Insufficient documentation

## 2011-12-17 DIAGNOSIS — E1169 Type 2 diabetes mellitus with other specified complication: Secondary | ICD-10-CM | POA: Insufficient documentation

## 2011-12-17 DIAGNOSIS — K0889 Other specified disorders of teeth and supporting structures: Secondary | ICD-10-CM

## 2011-12-17 LAB — BASIC METABOLIC PANEL
Calcium: 9.4 mg/dL (ref 8.4–10.5)
GFR calc Af Amer: >90 mL/min (ref >90)
GFR calc non Af Amer: >90 mL/min (ref >90)
Glucose, Bld: 57 mg/dL — ABNORMAL LOW (ref 70–99)
Potassium: 3.5 mEq/L (ref 3.5–5.1)
Sodium: 139 mEq/L (ref 135–145)

## 2011-12-17 LAB — GLUCOSE, CAPILLARY: Glucose-Capillary: 78 mg/dL (ref 70–99)

## 2011-12-17 MED ORDER — CLINDAMYCIN HCL 150 MG PO CAPS: 150.0000 mg | ORAL_CAPSULE | Freq: Four times a day (QID) | ORAL | Status: AC

## 2011-12-17 MED ORDER — HYDROCODONE-ACETAMINOPHEN 5-325 MG PO TABS
2.0000 | ORAL_TABLET | Freq: Once | ORAL | Status: AC
  Administered 2011-12-17: 2 via ORAL
  Filled 2011-12-17: qty 2

## 2011-12-17 MED ORDER — CLINDAMYCIN HCL 150 MG PO CAPS: 150.0000 mg | ORAL_CAPSULE | Freq: Four times a day (QID) | ORAL | Status: DC

## 2011-12-17 MED ORDER — HYDROCODONE-ACETAMINOPHEN 5-325 MG PO TABS: 2.0000 | ORAL_TABLET | ORAL | Status: AC | PRN

## 2011-12-17 NOTE — ED Notes (Signed)
Pt given a cheese stick

## 2011-12-17 NOTE — ED Provider Notes (Signed)
History     CSN: [id number]  Arrival date & time 12/17/11  1109   First MD Initiated Contact with Patient 12/17/11 1156      Chief Complaint  Patient presents with  . Hypoglycemia    (Consider location/radiation/quality/duration/timing/severity/associated sxs/prior treatment) Patient is a 45 y.o. female presenting with tooth pain and diabetes problem. The history is provided by the patient. No language interpreter was used.  Dental PainThe primary symptoms include mouth pain. The symptoms began 5 to 7 days ago. The symptoms are worsening. The symptoms are chronic. The symptoms occur constantly.  Additional symptoms include: gum tenderness.   Diabetes She presents for her follow-up diabetic visit. She has type 2 diabetes mellitus. No MedicAlert identification noted. Hypoglycemia symptoms include hunger. Symptoms are stable. Risk factors for coronary artery disease include diabetes mellitus. When asked about current treatments, none were reported. Her weight is stable. She is following a diabetic diet. When asked about meal planning, she reported none. She has not had a previous visit with a dietician. Her dinner blood glucose range is generally <70 mg/dl.  Pt reports her glucose has also been low.  Patient reports she was recently diagnosed with diabetes . She reports her glucose leave has been dropping every night after giving herself lantus.  Pt reports she is having to eat a lot of food at bedtime and in am to keep glucose upPast Medical History  Diagnosis Date  . Hypertension   . Diabetes mellitus     Past Surgical History  Procedure Date  . Breast lumpectomy   . Appendectomy   . Abdominal hysterectomy   . Cesarean section     Family History  Problem Relation Age of Onset  . Hypertension Father   . Stroke Father   . Cancer Sister 41    breast  . Diabetes Brother   . Cancer Paternal Aunt     breast  . Diabetes Paternal Aunt   . Cancer Maternal Grandmother     breast      History  Substance Use Topics  . Smoking status: Never Smoker   . Smokeless tobacco: Never Used  . Alcohol Use: No    OB History    Grav Para Term Preterm Abortions TAB SAB Ect Mult Living   2 2 2       2       Review of Systems  All other systems reviewed and are negative.    Allergies  Penicillins  Home Medications   Current Outpatient Rx  Name Route Sig Dispense Refill  . HYDROCOD POLST-CHLORPHEN POLST 10-8 MG/5ML PO LQCR Oral Take 5 mLs by mouth every 12 (twelve) hours. 140 mL 0  . CLEMASTINE FUMARATE 1.34 MG PO TABS Oral Take 1 tablet by mouth at bedtime as needed. For runny nose     . GLIMEPIRIDE 4 MG PO TABS Oral Take 4 mg by mouth every morning.      . INSULIN ASPART 100 UNIT/ML [place] SOLN Subcutaneous Inject 2-15 Units into the skin 3 (three) times daily before meals. According to sliding scale if blood sugar is 121-150: 2 units, 151-200: 3 units, 201-250: 5 units, 251-300: 8 units, 301-350: 11 units, 351-400: 15 units, if greater than 400, 15 units and contact PCP     . INSULIN GLARGINE 100 UNIT/ML [place] SOLN Subcutaneous Inject 40 Units into the skin at bedtime.      Marland Kitchen LISINOPRIL 20 MG PO TABS Oral Take 20 mg by mouth 2 (two) times daily.      Marland Kitchen  METOPROLOL TARTRATE 50 MG PO TABS Oral Take 50 mg by mouth 2 (two) times daily.      . NYSTATIN 100000 UNIT/GM EX CREA Topical Apply topically 2 (two) times daily. 30 g 0  . ZOLPIDEM TARTRATE 5 MG PO TABS Oral Take 5 mg by mouth at bedtime as needed. For sleep        BP 151/92  Pulse 79  Temp(Src) 97.8 F (36.6 C) (Oral)  Resp 20  SpO2 100%  Physical Exam  Nursing note and vitals reviewed. Constitutional: She appears well-developed and well-nourished.  HENT:  Head: Normocephalic and atraumatic.  Right Ear: External ear normal.  Left Ear: External ear normal.  Nose: Nose normal.  Mouth/Throat: Oropharynx is clear and moist.       Broken tooth ,  No sign of abscess  Eyes: Conjunctivae and EOM are normal. Pupils  are equal, round, and reactive to light.  Neck: Normal range of motion. Neck supple.  Cardiovascular: Normal rate.   Pulmonary/Chest: Effort normal and breath sounds normal.  Abdominal: Soft.  Musculoskeletal: Normal range of motion.  Neurological: She is alert.  Skin: Skin is warm.    ED Course  Procedures (including critical care time)   Labs Reviewed  GLUCOSE, CAPILLARY  POCT CBG MONITORING  BASIC METABOLIC PANEL   No results found.   No diagnosis found.    MDM  Advised patient I will treat her with penicillin and hydrocodone for toothache she is referred to Dr. Lucky Cowboy dentist on call. Patient is advised to decrease her evening insulin from 30 units of Lantus to 30 units of Lantus she is to continue to monitor her glucose carefully I have advised her to continue using her sliding scale. Patient is to  follow up with her primary care doctor as scheduled for recheck. Patient is in is observed in the emergency department for extended period of time. She is discharged with glucose of 127.       Langston Masker, [place] 12/17/11 1540  John Day, [place] 12/17/11 [phone]

## 2011-12-17 NOTE — ED Provider Notes (Addendum)
Medical screening examination/treatment/procedure(s) were performed by non-physician practitioner and as supervising physician I was immediately available for consultation/collaboration.  Pt does take glimeperide, insulin. Newly diagnosed DM. Lowest glu at home and in ED 57. Patient tolerating PO.  Will f/u with her PMD for recommendations regarding changes in her insulin. Per PA patient comfortable with d/c home and with borderline hypoglycemia only less concern for obs 2/2 glimeperide/insulin use.  Forbes Cellar, MD 12/17/11 1544  Forbes Cellar, MD 12/17/11 1606

## 2011-12-17 NOTE — ED Notes (Signed)
I did a CBG on the patient and it was 78. I am now documenting it in the chart and I have notified the patient's RN.

## 2011-12-17 NOTE — ED Notes (Signed)
States she was recently diagnosed with diabetes. Has been having problems with her blood sugar dropping. Cant be seen by [facility] until next Friday. Alert oriented. States she feels jittery. C.o tooth and head ache.

## 2011-12-22 ENCOUNTER — Encounter: Payer: Self-pay | Admitting: Family

## 2011-12-22 ENCOUNTER — Ambulatory Visit: Payer: Self-pay | Admitting: Family

## 2012-02-10 ENCOUNTER — Emergency Department (HOSPITAL_BASED_OUTPATIENT_CLINIC_OR_DEPARTMENT_OTHER)
Admission: EM | Admit: 2012-02-10 | Discharge: 2012-02-10 | Disposition: A | Discharge status: Home / Self Care | Payer: Medicaid Other | Attending: Emergency Medicine | Admitting: Emergency Medicine

## 2012-02-10 DIAGNOSIS — E119 Type 2 diabetes mellitus without complications: Secondary | ICD-10-CM | POA: Insufficient documentation

## 2012-02-10 DIAGNOSIS — I10 Essential (primary) hypertension: Secondary | ICD-10-CM | POA: Insufficient documentation

## 2012-02-10 DIAGNOSIS — K089 Disorder of teeth and supporting structures, unspecified: Secondary | ICD-10-CM | POA: Insufficient documentation

## 2012-02-10 DIAGNOSIS — K0889 Other specified disorders of teeth and supporting structures: Secondary | ICD-10-CM

## 2012-02-10 DIAGNOSIS — Z79899 Other long term (current) drug therapy: Secondary | ICD-10-CM | POA: Insufficient documentation

## 2012-02-10 LAB — GLUCOSE, CAPILLARY: Glucose-Capillary: 100 mg/dL — ABNORMAL HIGH (ref 70–99)

## 2012-02-10 MED ORDER — CLINDAMYCIN HCL 300 MG PO CAPS: 300.0000 mg | ORAL_CAPSULE | Freq: Four times a day (QID) | ORAL | Status: AC

## 2012-02-10 MED ORDER — HYDROCODONE-ACETAMINOPHEN 5-325 MG PO TABS: 2.0000 | ORAL_TABLET | ORAL | Status: AC | PRN

## 2012-02-10 NOTE — ED Notes (Signed)
CBG performed, 100

## 2012-02-10 NOTE — ED Provider Notes (Signed)
History     CSN: [id number]  Arrival date & time 02/10/12  1412   First MD Initiated Contact with Patient 02/10/12 1432      No chief complaint on file.   (Consider location/radiation/quality/duration/timing/severity/associated sxs/prior treatment) Patient is a 46 y.o. female presenting with dental injury. The history is provided by the patient. No language interpreter was used.  Dental Injury This is a recurrent problem. The current episode started 1 to 4 weeks ago. The problem occurs constantly. The problem has been gradually worsening. Pertinent negatives include no fever. The symptoms are aggravated by nothing. She has tried NSAIDs for the symptoms. The treatment provided no relief.    Past Medical History  Diagnosis Date  . Hypertension   . Diabetes mellitus     Past Surgical History  Procedure Date  . Breast lumpectomy   . Appendectomy   . Abdominal hysterectomy   . Cesarean section     Family History  Problem Relation Age of Onset  . Hypertension Father   . Stroke Father   . Cancer Sister 46    breast  . Diabetes Brother   . Cancer Paternal Aunt     breast  . Diabetes Paternal Aunt   . Cancer Maternal Grandmother     breast    History  Substance Use Topics  . Smoking status: Never Smoker   . Smokeless tobacco: Never Used  . Alcohol Use: No    OB History    Grav Para Term Preterm Abortions TAB SAB Ect Mult Living   2 2 2       2       Review of Systems  Constitutional: Negative for fever.  HENT: Positive for dental problem.   All other systems reviewed and are negative.    Allergies  Penicillins  Home Medications   Current Outpatient Rx  Name Route Sig Dispense Refill  . HYDROCOD POLST-CPM POLST ER 10-8 MG/5ML PO LQCR Oral Take 5 mLs by mouth every 12 (twelve) hours. 140 mL 0  . CLEMASTINE FUMARATE 1.34 MG PO TABS Oral Take 1 tablet by mouth at bedtime as needed. For runny nose     . GLIMEPIRIDE 4 MG PO TABS Oral Take 4 mg by mouth every  morning.      . INSULIN ASPART 100 UNIT/ML [place] SOLN Subcutaneous Inject 2-15 Units into the skin 3 (three) times daily before meals. According to sliding scale if blood sugar is 121-150: 2 units, 151-200: 3 units, 201-250: 5 units, 251-300: 8 units, 301-350: 11 units, 351-400: 15 units, if greater than 400, 15 units and contact PCP     . INSULIN GLARGINE 100 UNIT/ML [place] SOLN Subcutaneous Inject 40 Units into the skin at bedtime.      Marland Kitchen LISINOPRIL 20 MG PO TABS Oral Take 20 mg by mouth 2 (two) times daily.      Marland Kitchen METOPROLOL TARTRATE 50 MG PO TABS Oral Take 50 mg by mouth 2 (two) times daily.      . NYSTATIN 100000 UNIT/GM EX CREA Topical Apply topically 2 (two) times daily. 30 g 0  . ZOLPIDEM TARTRATE 5 MG PO TABS Oral Take 5 mg by mouth at bedtime as needed. For sleep        BP 126/78  Pulse 64  Temp(Src) 97.8 F (36.6 C) (Oral)  Resp 18  Ht 5\' 8"  (1.727 m)  Wt 350 lb (158.759 kg)  BMI 53.22 kg/m2  SpO2 96%  Physical Exam  Nursing note and vitals  reviewed. Constitutional: She is oriented to person, place, and time. She appears well-developed.  HENT:  Head: Normocephalic and atraumatic.  Right Ear: External ear normal.  Left Ear: External ear normal.  Nose: Nose normal.  Mouth/Throat: Oropharynx is clear and moist.       Decayed tooth  Eyes: Conjunctivae are normal. Pupils are equal, round, and reactive to light.  Neck: Normal range of motion.  Cardiovascular: Normal rate.   Pulmonary/Chest: Effort normal.  Abdominal: Soft.  Neurological: She is alert and oriented to person, place, and time.  Skin: Skin is warm.  Psychiatric: She has a normal mood and affect.    ED Course  Procedures (including critical care time)  Labs Reviewed  GLUCOSE, CAPILLARY - Abnormal; Notable for the following:    Glucose-Capillary 100 (*)    All other components within normal limits   No results found.   No diagnosis found.    MDM  Pt seen by me in dec for same.  Pt has not been able to  see a dentist        Langston Masker, [place] 02/10/12 1516

## 2012-02-10 NOTE — Discharge Instructions (Signed)
Dental Pain Toothache is pain in or around a tooth. It may get worse with chewing or with cold or heat.  HOME CARE  Your dentist may use a numbing medicine during treatment. If so, you may need to avoid eating until the medicine wears off. Ask your dentist about this.   Only take medicine as told by your dentist or doctor.   Avoid chewing food near the painful tooth until after all treatment is done. Ask your dentist about this.  GET HELP RIGHT AWAY IF:   The problem gets worse or new problems appear.   You have a fever.   There is redness and puffiness (swelling) of the face, jaw, or neck.   You cannot open your mouth.   There is pain in the jaw.   There is very bad pain that is not helped by medicine.  MAKE SURE YOU:   Understand these instructions.   Will watch your condition.   Will get help right away if you are not doing well or get worse.  Document Released: 05/31/2008 Document Revised: 08/25/2011 Document Reviewed: 05/31/2008 [facility] Patient Information 2012 [place], [place].Dental Pain Toothache is pain in or around a tooth. It may get worse with chewing or with cold or heat.  HOME CARE  Your dentist may use a numbing medicine during treatment. If so, you may need to avoid eating until the medicine wears off. Ask your dentist about this.   Only take medicine as told by your dentist or doctor.   Avoid chewing food near the painful tooth until after all treatment is done. Ask your dentist about this.  GET HELP RIGHT AWAY IF:   The problem gets worse or new problems appear.   You have a fever.   There is redness and puffiness (swelling) of the face, jaw, or neck.   You cannot open your mouth.   There is pain in the jaw.   There is very bad pain that is not helped by medicine.  MAKE SURE YOU:   Understand these instructions.   Will watch your condition.   Will get help right away if you are not doing well or get worse.  Document Released: 05/31/2008  Document Revised: 08/25/2011 Document Reviewed: 05/31/2008 [facility] Patient Information 2012 [place], [place].

## 2012-02-10 NOTE — ED Provider Notes (Signed)
Medical screening examination/treatment/procedure(s) were performed by non-physician practitioner and as supervising physician I was immediately available for consultation/collaboration.  Tremeka Helbling, MD 02/10/12 1542 

## 2012-05-08 ENCOUNTER — Other Ambulatory Visit: Payer: Self-pay | Admitting: Internal Medicine

## 2012-05-21 ENCOUNTER — Emergency Department (HOSPITAL_COMMUNITY)
Admission: EM | Admit: 2012-05-21 | Discharge: 2012-05-22 | Disposition: A | Discharge status: Home / Self Care | Payer: Medicaid Other | Attending: Emergency Medicine | Admitting: Emergency Medicine

## 2012-05-21 ENCOUNTER — Encounter (HOSPITAL_COMMUNITY): Payer: Self-pay

## 2012-05-21 DIAGNOSIS — I10 Essential (primary) hypertension: Secondary | ICD-10-CM | POA: Insufficient documentation

## 2012-05-21 DIAGNOSIS — R51 Headache: Secondary | ICD-10-CM | POA: Insufficient documentation

## 2012-05-21 DIAGNOSIS — E119 Type 2 diabetes mellitus without complications: Secondary | ICD-10-CM | POA: Insufficient documentation

## 2012-05-21 DIAGNOSIS — Z794 Long term (current) use of insulin: Secondary | ICD-10-CM | POA: Insufficient documentation

## 2012-05-21 HISTORY — DX: Headache: R51

## 2012-05-21 LAB — GLUCOSE, CAPILLARY: Glucose-Capillary: 92 mg/dL (ref 70–99)

## 2012-05-21 NOTE — ED Notes (Signed)
Pt presents with no acute distress- generalized HA x 4 days- has taken tramadol and motrin without relief.  Denies NV/D and fever

## 2012-05-21 NOTE — ED Notes (Signed)
Pt has only been seen in ED for headaches- does not have  DX of migraine

## 2012-05-22 ENCOUNTER — Emergency Department (HOSPITAL_COMMUNITY): Payer: Medicaid Other

## 2012-05-22 MED ORDER — ONDANSETRON HCL 4 MG/2ML IJ SOLN
4.0000 mg | Freq: Once | INTRAMUSCULAR | Status: AC
  Administered 2012-05-22: 4 mg via INTRAVENOUS
  Filled 2012-05-22: qty 2

## 2012-05-22 MED ORDER — OXYCODONE-ACETAMINOPHEN 5-325 MG PO TABS
1.0000 | ORAL_TABLET | Freq: Once | ORAL | Status: AC
  Administered 2012-05-22: 1 via ORAL
  Filled 2012-05-22: qty 1

## 2012-05-22 MED ORDER — SODIUM CHLORIDE 0.9 % IV BOLUS (SEPSIS): 1000.0000 mL | Freq: Once | INTRAVENOUS | Status: DC

## 2012-05-22 MED ORDER — OXYCODONE-ACETAMINOPHEN 5-325 MG PO TABS: 1.0000 | ORAL_TABLET | ORAL | Status: AC | PRN

## 2012-05-22 MED ORDER — HYDROMORPHONE HCL PF 1 MG/ML IJ SOLN
1.0000 mg | Freq: Once | INTRAMUSCULAR | Status: AC
  Administered 2012-05-22: 1 mg via INTRAVENOUS
  Filled 2012-05-22: qty 1

## 2012-05-22 NOTE — ED Notes (Signed)
Patient transported to CT 

## 2012-05-22 NOTE — Discharge Instructions (Signed)
Headaches, Frequently Asked Questions MIGRAINE HEADACHES Q: What is migraine? What causes it? How can I treat it? A: Generally, migraine headaches begin as a dull ache. Then they develop into a constant, throbbing, and pulsating pain. You may experience pain at the temples. You may experience pain at the front or back of one or both sides of the head. The pain is usually accompanied by a combination of:  Nausea.   Vomiting.   Sensitivity to light and noise.  Some people (about 15%) experience an aura (see below) before an attack. The cause of migraine is believed to be chemical reactions in the brain. Treatment for migraine may include over-the-counter or prescription medications. It may also include self-help techniques. These include relaxation training and biofeedback.  Q: What is an aura? A: About 15% of people with migraine get an "aura". This is a sign of neurological symptoms that occur before a migraine headache. You may see wavy or jagged lines, dots, or flashing lights. You might experience tunnel vision or blind spots in one or both eyes. The aura can include visual or auditory hallucinations (something imagined). It may include disruptions in smell (such as strange odors), taste or touch. Other symptoms include:  Numbness.   A "pins and needles" sensation.   Difficulty in recalling or speaking the correct word.  These neurological events may last as long as 60 minutes. These symptoms will fade as the headache begins. Q: What is a trigger? A: Certain physical or environmental factors can lead to or "trigger" a migraine. These include:  Foods.   Hormonal changes.   Weather.   Stress.  It is important to remember that triggers are different for everyone. To help prevent migraine attacks, you need to figure out which triggers affect you. Keep a headache diary. This is a good way to track triggers. The diary will help you talk to your healthcare professional about your  condition. Q: Does weather affect migraines? A: Bright sunshine, hot, humid conditions, and drastic changes in barometric pressure may lead to, or "trigger," a migraine attack in some people. But studies have shown that weather does not act as a trigger for everyone with migraines. Q: What is the link between migraine and hormones? A: Hormones start and regulate many of your body's functions. Hormones keep your body in balance within a constantly changing environment. The levels of hormones in your body are unbalanced at times. Examples are during menstruation, pregnancy, or menopause. That can lead to a migraine attack. In fact, about three quarters of all women with migraine report that their attacks are related to the menstrual cycle.  Q: Is there an increased risk of stroke for migraine sufferers? A: The likelihood of a migraine attack causing a stroke is very remote. That is not to say that migraine sufferers cannot have a stroke associated with their migraines. In persons under age 40, the most common associated factor for stroke is migraine headache. But over the course of a person's normal life span, the occurrence of migraine headache may actually be associated with a reduced risk of dying from cerebrovascular disease due to stroke.  Q: What are acute medications for migraine? A: Acute medications are used to treat the pain of the headache after it has started. Examples over-the-counter medications, NSAIDs, ergots, and triptans.  Q: What are the triptans? A: Triptans are the newest class of abortive medications. They are specifically targeted to treat migraine. Triptans are vasoconstrictors. They moderate some chemical reactions in the brain.   The triptans work on receptors in your brain. Triptans help to restore the balance of a neurotransmitter called serotonin. Fluctuations in levels of serotonin are thought to be a main cause of migraine.  Q: Are over-the-counter medications for migraine  effective? A: Over-the-counter, or "OTC," medications may be effective in relieving mild to moderate pain and associated symptoms of migraine. But you should see your caregiver before beginning any treatment regimen for migraine.  Q: What are preventive medications for migraine? A: Preventive medications for migraine are sometimes referred to as "prophylactic" treatments. They are used to reduce the frequency, severity, and length of migraine attacks. Examples of preventive medications include antiepileptic medications, antidepressants, beta-blockers, calcium channel blockers, and NSAIDs (nonsteroidal anti-inflammatory drugs). Q: Why are anticonvulsants used to treat migraine? A: During the past few years, there has been an increased interest in antiepileptic drugs for the prevention of migraine. They are sometimes referred to as "anticonvulsants". Both epilepsy and migraine may be caused by similar reactions in the brain.  Q: Why are antidepressants used to treat migraine? A: Antidepressants are typically used to treat people with depression. They may reduce migraine frequency by regulating chemical levels, such as serotonin, in the brain.  Q: What alternative therapies are used to treat migraine? A: The term "alternative therapies" is often used to describe treatments considered outside the scope of conventional Western medicine. Examples of alternative therapy include acupuncture, acupressure, and yoga. Another common alternative treatment is herbal therapy. Some herbs are believed to relieve headache pain. Always discuss alternative therapies with your caregiver before proceeding. Some herbal products contain arsenic and other toxins. TENSION HEADACHES Q: What is a tension-type headache? What causes it? How can I treat it? A: Tension-type headaches occur randomly. They are often the result of temporary stress, anxiety, fatigue, or anger. Symptoms include soreness in your temples, a tightening  band-like sensation around your head (a "vice-like" ache). Symptoms can also include a pulling feeling, pressure sensations, and contracting head and neck muscles. The headache begins in your forehead, temples, or the back of your head and neck. Treatment for tension-type headache may include over-the-counter or prescription medications. Treatment may also include self-help techniques such as relaxation training and biofeedback. CLUSTER HEADACHES Q: What is a cluster headache? What causes it? How can I treat it? A: Cluster headache gets its name because the attacks come in groups. The pain arrives with little, if any, warning. It is usually on one side of the head. A tearing or bloodshot eye and a runny nose on the same side of the headache may also accompany the pain. Cluster headaches are believed to be caused by chemical reactions in the brain. They have been described as the most severe and intense of any headache type. Treatment for cluster headache includes prescription medication and oxygen. SINUS HEADACHES Q: What is a sinus headache? What causes it? How can I treat it? A: When a cavity in the bones of the face and skull (a sinus) becomes inflamed, the inflammation will cause localized pain. This condition is usually the result of an allergic reaction, a tumor, or an infection. If your headache is caused by a sinus blockage, such as an infection, you will probably have a fever. An x-ray will confirm a sinus blockage. Your caregiver's treatment might include antibiotics for the infection, as well as antihistamines or decongestants.  REBOUND HEADACHES Q: What is a rebound headache? What causes it? How can I treat it? A: A pattern of taking acute headache medications too   often can lead to a condition known as "rebound headache." A pattern of taking too much headache medication includes taking it more than 2 days per week or in excessive amounts. That means more than the label or a caregiver advises.  With rebound headaches, your medications not only stop relieving pain, they actually begin to cause headaches. Doctors treat rebound headache by tapering the medication that is being overused. Sometimes your caregiver will gradually substitute a different type of treatment or medication. Stopping may be a challenge. Regularly overusing a medication increases the potential for serious side effects. Consult a caregiver if you regularly use headache medications more than 2 days per week or more than the label advises. ADDITIONAL QUESTIONS AND ANSWERS Q: What is biofeedback? A: Biofeedback is a self-help treatment. Biofeedback uses special equipment to monitor your body's involuntary physical responses. Biofeedback monitors:  Breathing.   Pulse.   Heart rate.   Temperature.   Muscle tension.   Brain activity.  Biofeedback helps you refine and perfect your relaxation exercises. You learn to control the physical responses that are related to stress. Once the technique has been mastered, you do not need the equipment any more. Q: Are headaches hereditary? A: Four out of five (80%) of people that suffer report a family history of migraine. Scientists are not sure if this is genetic or a family predisposition. Despite the uncertainty, a child has a 50% chance of having migraine if one parent suffers. The child has a 75% chance if both parents suffer.  Q: Can children get headaches? A: By the time they reach high school, most young people have experienced some type of headache. Many safe and effective approaches or medications can prevent a headache from occurring or stop it after it has begun.  Q: What type of doctor should I see to diagnose and treat my headache? A: Start with your primary caregiver. Discuss his or her experience and approach to headaches. Discuss methods of classification, diagnosis, and treatment. Your caregiver may decide to recommend you to a headache specialist, depending upon  your symptoms or other physical conditions. Having diabetes, allergies, etc., may require a more comprehensive and inclusive approach to your headache. The National Headache Foundation will provide, upon request, a list of NHF physician members in your state. Document Released: 03/04/2004 Document Revised: 12/02/2011 Document Reviewed: 08/12/2008 [employer] Patient Information 2012 [employer].Headache, General, Unknown Cause The specific cause of your headache may not have been found today. There are many causes and types of headache. A few common ones are:  Tension headache.   Migraine.   Infections (examples: dental and sinus infections).   Bone and/or joint problems in the neck or jaw.   Depression.   Eye problems.  These headaches are not life threatening.  Headaches can sometimes be diagnosed by a patient history and a physical exam. Sometimes, lab and imaging studies (such as x-ray and/or CT scan) are used to rule out more serious problems. In some cases, a spinal tap (lumbar puncture) may be requested. There are many times when your exam and tests may be normal on the first visit even when there is a serious problem causing your headaches. Because of that, it is very important to follow up with your doctor or local clinic for further evaluation. FINDING OUT THE RESULTS OF TESTS  If a radiology test was performed, a radiologist will review your results.   You will be contacted by the emergency department or your physician if any test results   require a change in your treatment plan.   Not all test results may be available during your visit. If your test results are not back during the visit, make an appointment with your caregiver to find out the results. Do not assume everything is normal if you have not heard from your caregiver or the medical facility. It is important for you to follow up on all of your test results.  HOME CARE INSTRUCTIONS   Keep follow-up appointments with  your caregiver, or any specialist referral.   Only take over-the-counter or prescription medicines for pain, discomfort, or fever as directed by your caregiver.   Biofeedback, massage, or other relaxation techniques may be helpful.   Ice packs or heat applied to the head and neck can be used. Do this three to four times per day, or as needed.   Call your doctor if you have any questions or concerns.   If you smoke, you should quit.  SEEK MEDICAL CARE IF:   You develop problems with medications prescribed.   You do not respond to or obtain relief from medications.   You have a change from the usual headache.   You develop nausea or vomiting.  SEEK IMMEDIATE MEDICAL CARE IF:   If your headache becomes severe.   You have an unexplained oral temperature above 102 F (38.9 C), or as your caregiver suggests.   You have a stiff neck.   You have loss of vision.   You have muscular weakness.   You have loss of muscular control.   You develop severe symptoms different from your first symptoms.   You start losing your balance or have trouble walking.   You feel faint or pass out.  MAKE SURE YOU:   Understand these instructions.   Will watch your condition.   Will get help right away if you are not doing well or get worse.  Document Released: 12/13/2005 Document Revised: 12/02/2011 Document Reviewed: 08/01/2008 [employer] Patient Information 2012 [employer]. 

## 2012-05-22 NOTE — ED Notes (Signed)
Patient given discharge instructions, information, prescriptions, and diet order. Patient states that they adequately understand discharge information given and to return to ED if symptoms return or worsen.     

## 2012-05-22 NOTE — ED Notes (Signed)
Patient returned from CT

## 2012-05-22 NOTE — ED Notes (Signed)
Pt sts she has had headache x 4 days. Has used ibuprofen ad tramadol with no relief. Did not take anything today and sts pain as 7/10 and nagging in the anterior portion of her head. Pt says she has not had any injury or trauma to her head in the past week. Lights dimmed for patient comfort.

## 2012-05-22 NOTE — ED Provider Notes (Signed)
History     CSN: [id number]  Arrival date & time 05/21/12  2147   First MD Initiated Contact with Patient 05/22/12 0103      Chief Complaint  Patient presents with  . Headache    (Consider location/radiation/quality/duration/timing/severity/associated sxs/prior treatment) HPI Comments: Patient with a history of migraine type headaches presents with 4 day history of bilateral temporal headache without fever, chills, photophobia, neck stiffness, pain, nausea or vomiting.  She reports she has tried her home medication of motrin and tramadol without relief.  She denies weakness, numbness tingling, difficulty walking, slurred speech, blurred vision.  Patient is a 46 y.o. female presenting with headaches. The history is provided by the patient. No language interpreter was used.  Headache  This is a new problem. The current episode started more than 2 days ago. The problem occurs constantly. The problem has not changed since onset.The headache is associated with nothing. The pain is located in the temporal and bilateral region. The quality of the pain is described as throbbing. The pain is at a severity of 6/10. The pain is moderate. The pain does not radiate. Pertinent negatives include no anorexia, no fever, no malaise/fatigue, no chest pressure, no near-syncope, no orthopnea, no palpitations, no syncope, no shortness of breath, no nausea and no vomiting. She has tried NSAIDs and oral narcotic analgesics for the symptoms. The treatment provided no relief.    Past Medical History  Diagnosis Date  . Hypertension   . Diabetes mellitus   . Headache     Past Surgical History  Procedure Date  . Breast lumpectomy   . Appendectomy   . Abdominal hysterectomy   . Cesarean section     Family History  Problem Relation Age of Onset  . Hypertension Father   . Stroke Father   . Cancer Sister 88    breast  . Diabetes Brother   . Cancer Paternal Aunt     breast  . Diabetes Paternal Aunt   .  Cancer Maternal Grandmother     breast    History  Substance Use Topics  . Smoking status: Never Smoker   . Smokeless tobacco: Never Used  . Alcohol Use: No    OB History    Grav Para Term Preterm Abortions TAB SAB Ect Mult Living   2 2 2       2       Review of Systems  Constitutional: Negative for fever and malaise/fatigue.  Respiratory: Negative for shortness of breath.   Cardiovascular: Negative for palpitations, orthopnea, syncope and near-syncope.  Gastrointestinal: Negative for nausea, vomiting and anorexia.  Neurological: Positive for headaches.    Allergies  Penicillins  Home Medications   Current Outpatient Rx  Name Route Sig Dispense Refill  . INSULIN GLARGINE 100 UNIT/ML [place] SOLN Subcutaneous Inject 20 Units into the skin at bedtime.     Marland Kitchen LISINOPRIL 20 MG PO TABS Oral Take 20 mg by mouth 2 (two) times daily.      Marland Kitchen METOPROLOL TARTRATE 50 MG PO TABS Oral Take 50 mg by mouth 2 (two) times daily.        BP 149/83  Pulse 69  Temp(Src) 98 F (36.7 C) (Oral)  Resp 18  SpO2 100%  Physical Exam  Nursing note and vitals reviewed. Constitutional: She is oriented to person, place, and time. She appears well-developed and well-nourished. No distress.  HENT:  Head: Normocephalic and atraumatic.  Right Ear: External ear normal.  Left Ear: External ear normal.  Nose: Nose normal.  Mouth/Throat: Oropharynx is clear and moist. No oropharyngeal exudate.       No scalp or facial tenderness - mild ttp to temporal area  Eyes: Conjunctivae are normal. Pupils are equal, round, and reactive to light. No scleral icterus.       No hemorrhage or flares noted.  Neck: Normal range of motion. Neck supple. No Brudzinski's sign and no Kernig's sign noted.  Cardiovascular: Normal rate, regular rhythm and normal heart sounds.  Exam reveals no gallop and no friction rub.   No murmur heard. Pulmonary/Chest: Effort normal and breath sounds normal. No respiratory distress. She has no  wheezes. She has no rales. She exhibits no tenderness.  Abdominal: Soft. Bowel sounds are normal. She exhibits no distension. There is no tenderness.  Musculoskeletal: Normal range of motion. She exhibits no edema and no tenderness.  Lymphadenopathy:    She has no cervical adenopathy.  Neurological: She is alert and oriented to person, place, and time. No cranial nerve deficit. She exhibits normal muscle tone. Coordination normal.  Skin: Skin is warm and dry. No rash noted. No erythema. No pallor.  Psychiatric: She has a normal mood and affect. Her behavior is normal. Judgment and thought content normal.    ED Course  Procedures (including critical care time)   Labs Reviewed  GLUCOSE, CAPILLARY   Ct Head Wo Contrast  05/22/2012  *RADIOLOGY REPORT*  Clinical Data: Frontal headache for 4 days.  CT HEAD WITHOUT CONTRAST  Technique:  Contiguous axial images were obtained from the base of the skull through the vertex without contrast.  Comparison: 02/02/2010  Findings: The ventricles and sulci are symmetrical without significant effacement, displacement, or dilatation. No mass effect or midline shift. No abnormal extra-axial fluid collections. The grey-white matter junction is distinct. Basal cisterns are not effaced. No acute intracranial hemorrhage. No depressed skull fractures.  Mild expansion of the sella turcica with low attenuation change suggesting empty sella.  Retention cyst in the floor of the right maxillary antrum.  Mastoid air cells are not opacified. Bowing of the medial wall of the right orbit may represent old fracture deformity.  No significant changes in the intracranial contents since the previous study.  IMPRESSION: No acute intracranial abnormalities.  Original Report Authenticated By: Marlon Pel, M.D.     Headache    MDM  Patient with acute onset of headache which is not like her usual migraine type headache - reports relief with dilaudid - no evidence of  intercranial abnormalities on CT scan - neurological exam is normal here - will discharge home with pain medication and will follow up with PCP .        Izola Price [place], [place] 05/22/12 [phone]

## 2012-05-23 NOTE — ED Provider Notes (Signed)
Medical screening examination/treatment/procedure(s) were performed by non-physician practitioner and as supervising physician I was immediately available for consultation/collaboration.   Vida Roller, MD 05/23/12 [phone]

## 2012-07-20 ENCOUNTER — Emergency Department (HOSPITAL_COMMUNITY)
Admission: EM | Admit: 2012-07-20 | Discharge: 2012-07-21 | Disposition: A | Discharge status: Home / Self Care | Payer: Self-pay | Attending: Emergency Medicine | Admitting: Emergency Medicine

## 2012-07-20 ENCOUNTER — Encounter (HOSPITAL_COMMUNITY): Payer: Self-pay | Admitting: *Deleted

## 2012-07-20 DIAGNOSIS — Z803 Family history of malignant neoplasm of breast: Secondary | ICD-10-CM | POA: Insufficient documentation

## 2012-07-20 DIAGNOSIS — Z833 Family history of diabetes mellitus: Secondary | ICD-10-CM | POA: Insufficient documentation

## 2012-07-20 DIAGNOSIS — R51 Headache: Secondary | ICD-10-CM | POA: Insufficient documentation

## 2012-07-20 DIAGNOSIS — Z823 Family history of stroke: Secondary | ICD-10-CM | POA: Insufficient documentation

## 2012-07-20 DIAGNOSIS — Z8249 Family history of ischemic heart disease and other diseases of the circulatory system: Secondary | ICD-10-CM | POA: Insufficient documentation

## 2012-07-20 DIAGNOSIS — Z794 Long term (current) use of insulin: Secondary | ICD-10-CM | POA: Insufficient documentation

## 2012-07-20 DIAGNOSIS — Z90710 Acquired absence of both cervix and uterus: Secondary | ICD-10-CM | POA: Insufficient documentation

## 2012-07-20 DIAGNOSIS — G43709 Chronic migraine without aura, not intractable, without status migrainosus: Secondary | ICD-10-CM

## 2012-07-20 DIAGNOSIS — I10 Essential (primary) hypertension: Secondary | ICD-10-CM | POA: Insufficient documentation

## 2012-07-20 DIAGNOSIS — E119 Type 2 diabetes mellitus without complications: Secondary | ICD-10-CM | POA: Insufficient documentation

## 2012-07-20 NOTE — ED Notes (Signed)
Pt reports migraine HA that began yesterday, pt took tramadol this a.m. W/o relief - pt has been seen here previously for the same and had head CT that was unremarkable. Pt admits to some nausea, no vomiting.

## 2012-07-20 NOTE — ED Notes (Signed)
Pt c/o headache and nausea x1 day.  Pt has hx of headaches.  Denies vomiting and blurred vision.

## 2012-07-21 MED ORDER — OXYCODONE-ACETAMINOPHEN 5-325 MG PO TABS: 1.0000 | ORAL_TABLET | ORAL | Status: AC | PRN

## 2012-07-21 MED ORDER — OXYCODONE-ACETAMINOPHEN 5-325 MG PO TABS
1.0000 | ORAL_TABLET | Freq: Once | ORAL | Status: AC
  Administered 2012-07-21: 1 via ORAL
  Filled 2012-07-21: qty 1

## 2012-07-21 MED ORDER — HYDROMORPHONE HCL PF 1 MG/ML IJ SOLN
1.0000 mg | Freq: Once | INTRAMUSCULAR | Status: AC
  Administered 2012-07-21: 1 mg via INTRAMUSCULAR
  Filled 2012-07-21: qty 1

## 2012-07-21 NOTE — ED Provider Notes (Signed)
History     CSN: [id number]  Arrival date & time 07/20/12  2151   First MD Initiated Contact with Patient 07/20/12 2318      12:13 AM HPI Patient reports a typical migraine that began 2 days ago. Reports pain was gradual and has worsened. Reports trying to take tramadol without relief. Describes pain as throbbing. Reports pain is typical for her migraines just uncontrolled. Last migraine was 3 months ago. States she received Dilaudid and Percocet which significantly relieved pain. Reports that time she had a CT scan which was unremarkable. Denies fever, neck pain, head injury, eye pain, rash. Patient is a 46 y.o. female presenting with headaches. The history is provided by the patient.  Headache  This is a chronic problem. The current episode started 2 days ago. The problem occurs constantly. The problem has not changed since onset.The headache is associated with bright light. The pain is located in the frontal region. The quality of the pain is described as throbbing. The pain is severe. The pain does not radiate. Associated symptoms include nausea. Pertinent negatives include no anorexia, no fever, no near-syncope, no palpitations, no syncope, no shortness of breath and no vomiting. She has tried acetaminophen, NSAIDs and oral narcotic analgesics for the symptoms. The treatment provided no relief.    Past Medical History  Diagnosis Date  . Hypertension   . Diabetes mellitus   . Headache     Past Surgical History  Procedure Date  . Breast lumpectomy   . Appendectomy   . Abdominal hysterectomy   . Cesarean section     Family History  Problem Relation Age of Onset  . Hypertension Father   . Stroke Father   . Cancer Sister 35    breast  . Diabetes Brother   . Cancer Paternal Aunt     breast  . Diabetes Paternal Aunt   . Cancer Maternal Grandmother     breast    History  Substance Use Topics  . Smoking status: Never Smoker   . Smokeless tobacco: Never Used  . Alcohol  Use: No    OB History    Grav Para Term Preterm Abortions TAB SAB Ect Mult Living   2 2 2       2       Review of Systems  Constitutional: Negative for fever and chills.  HENT: Negative for congestion, sore throat, rhinorrhea, trouble swallowing, neck pain, neck stiffness, postnasal drip and sinus pressure.   Respiratory: Negative for cough and shortness of breath.   Cardiovascular: Negative for palpitations, syncope and near-syncope.  Gastrointestinal: Positive for nausea. Negative for vomiting and anorexia.  Musculoskeletal: Negative for back pain.  Neurological: Positive for headaches. Negative for dizziness, seizures, speech difficulty, weakness, light-headedness and numbness.  All other systems reviewed and are negative.    Allergies  Penicillins  Home Medications   Current Outpatient Rx  Name Route Sig Dispense Refill  . INSULIN GLARGINE 100 UNIT/ML [place] SOLN Subcutaneous Inject 20 Units into the skin at bedtime.     Marland Kitchen LISINOPRIL 20 MG PO TABS Oral Take 20 mg by mouth 2 (two) times daily.      Marland Kitchen METOPROLOL TARTRATE 50 MG PO TABS Oral Take 50 mg by mouth 2 (two) times daily.      . TRAMADOL HCL 50 MG PO TABS Oral Take 50 mg by mouth every 6 (six) hours as needed. For pain      BP 135/95  Pulse 70  Temp 97.6 F (  36.4 C) (Oral)  Resp 16  SpO2 98%  Physical Exam  Vitals reviewed. Constitutional: She is oriented to person, place, and time. Vital signs are normal. She appears well-developed and well-nourished. No distress.  HENT:  Head: Normocephalic and atraumatic.  Mouth/Throat: Oropharynx is clear and moist. No oropharyngeal exudate.  Eyes: Conjunctivae are normal. Pupils are equal, round, and reactive to light.  Neck: Normal range of motion. Neck supple.  Cardiovascular: Normal rate, regular rhythm and normal heart sounds.  Exam reveals no friction rub.   No murmur heard. Pulmonary/Chest: Effort normal and breath sounds normal. She has no wheezes. She has no  rhonchi. She has no rales. She exhibits no tenderness.  Musculoskeletal: Normal range of motion.  Neurological: She is alert and oriented to person, place, and time. No cranial nerve deficit (tested CN III-XII). She exhibits normal muscle tone (nml grip strength). Coordination (no nystagmus, neg pronator drift) normal.  Skin: Skin is warm and dry. No rash noted. No erythema. No pallor.    ED Course  Procedures    MDM    Prior charts reviewed. Last visit for headache was in May 2013. Patient had received IM Dilaudid and Percocet as well as a prescription. Will provide patient with similar therapy but only a prescription for 4 Percocet. Advised patient that I do not typically treat  migraines this way and if she would like further treatment for migraines should [facility]. Provided patient with address and phone number. Patient voices understanding and is ready for discharge. Reports pain is somewhat relieved. Patient is unchanged from typical headaches I do not feel further imaging or studies as required.      Thomasene Lot, PA-C 07/21/12 0040

## 2012-07-22 NOTE — ED Provider Notes (Signed)
Medical screening examination/treatment/procedure(s) were performed by non-physician practitioner and as supervising physician I was immediately available for consultation/collaboration.   Briah Nary L Audi [place], MD 07/22/12 0621 

## 2012-08-15 ENCOUNTER — Encounter (HOSPITAL_BASED_OUTPATIENT_CLINIC_OR_DEPARTMENT_OTHER): Payer: Self-pay | Admitting: *Deleted

## 2012-08-15 ENCOUNTER — Emergency Department (HOSPITAL_BASED_OUTPATIENT_CLINIC_OR_DEPARTMENT_OTHER)
Admission: EM | Admit: 2012-08-15 | Discharge: 2012-08-15 | Disposition: A | Discharge status: Home / Self Care | Payer: Medicaid Other | Attending: Emergency Medicine | Admitting: Emergency Medicine

## 2012-08-15 DIAGNOSIS — M25539 Pain in unspecified wrist: Secondary | ICD-10-CM | POA: Insufficient documentation

## 2012-08-15 DIAGNOSIS — Z9089 Acquired absence of other organs: Secondary | ICD-10-CM | POA: Insufficient documentation

## 2012-08-15 DIAGNOSIS — I10 Essential (primary) hypertension: Secondary | ICD-10-CM | POA: Insufficient documentation

## 2012-08-15 DIAGNOSIS — M65849 Other synovitis and tenosynovitis, unspecified hand: Secondary | ICD-10-CM | POA: Insufficient documentation

## 2012-08-15 DIAGNOSIS — M778 Other enthesopathies, not elsewhere classified: Secondary | ICD-10-CM

## 2012-08-15 DIAGNOSIS — E119 Type 2 diabetes mellitus without complications: Secondary | ICD-10-CM | POA: Insufficient documentation

## 2012-08-15 DIAGNOSIS — M65839 Other synovitis and tenosynovitis, unspecified forearm: Secondary | ICD-10-CM | POA: Insufficient documentation

## 2012-08-15 DIAGNOSIS — N760 Acute vaginitis: Secondary | ICD-10-CM | POA: Insufficient documentation

## 2012-08-15 DIAGNOSIS — A499 Bacterial infection, unspecified: Secondary | ICD-10-CM | POA: Insufficient documentation

## 2012-08-15 DIAGNOSIS — B9689 Other specified bacterial agents as the cause of diseases classified elsewhere: Secondary | ICD-10-CM | POA: Insufficient documentation

## 2012-08-15 DIAGNOSIS — Z794 Long term (current) use of insulin: Secondary | ICD-10-CM | POA: Insufficient documentation

## 2012-08-15 LAB — CBC WITH DIFFERENTIAL/PLATELET
Basophils Relative: 0 % (ref 0–1)
Eosinophils Absolute: 0.1 10*3/uL (ref 0.0–0.7)
HCT: 41.2 % (ref 36.0–46.0)
Hemoglobin: 14.3 g/dL (ref 12.0–15.0)
Lymphs Abs: 2.7 10*3/uL (ref 0.7–4.0)
MCH: 29.9 pg (ref 26.0–34.0)
MCHC: 34.7 g/dL (ref 30.0–36.0)
MCV: 86.2 fL (ref 78.0–100.0)
Monocytes Absolute: 0.7 10*3/uL (ref 0.1–1.0)
Monocytes Relative: 12 % (ref 3–12)
Neutrophils Relative %: 45 % (ref 43–77)
RBC: 4.78 MIL/uL (ref 3.87–5.11)

## 2012-08-15 LAB — URINALYSIS, ROUTINE W REFLEX MICROSCOPIC
Bilirubin Urine: NEGATIVE
Glucose, UA: NEGATIVE mg/dL
Hgb urine dipstick: NEGATIVE
Ketones, ur: NEGATIVE mg/dL
Specific Gravity, Urine: 1.024 (ref 1.005–1.030)
pH: 6.5 (ref 5.0–8.0)

## 2012-08-15 LAB — BASIC METABOLIC PANEL
BUN: 11 mg/dL (ref 6–23)
Chloride: 103 mEq/L (ref 96–112)
Creatinine, Ser: 0.8 mg/dL (ref 0.50–1.10)
GFR calc Af Amer: >90 mL/min (ref >90)
GFR calc non Af Amer: 86 mL/min — ABNORMAL LOW (ref >90)
Glucose, Bld: 99 mg/dL (ref 70–99)
Potassium: 4 mEq/L (ref 3.5–5.1)

## 2012-08-15 LAB — URINE MICROSCOPIC-ADD ON

## 2012-08-15 LAB — WET PREP, GENITAL

## 2012-08-15 MED ORDER — HYDROCODONE-ACETAMINOPHEN 5-325 MG PO TABS
2.0000 | ORAL_TABLET | Freq: Once | ORAL | Status: AC
  Administered 2012-08-15: 2 via ORAL
  Filled 2012-08-15: qty 2

## 2012-08-15 MED ORDER — HYDROCODONE-ACETAMINOPHEN 5-325 MG PO TABS: 2.0000 | ORAL_TABLET | ORAL | Status: AC | PRN

## 2012-08-15 MED ORDER — METRONIDAZOLE 500 MG PO TABS: 500.0000 mg | ORAL_TABLET | Freq: Two times a day (BID) | ORAL | Status: AC

## 2012-08-15 NOTE — ED Provider Notes (Signed)
History     CSN: [id number]  Arrival date & time 08/15/12  1749   First MD Initiated Contact with Patient 08/15/12 1823      Chief Complaint  Patient presents with  . Vaginal Itching  . Wrist Pain    (Consider location/radiation/quality/duration/timing/severity/associated sxs/prior treatment) Patient is a 46 y.o. female presenting with vaginal discharge. The history is provided by the patient. No language interpreter was used.  Vaginal Discharge This is a new problem. The current episode started 1 to 4 weeks ago. The problem has been gradually worsening. Associated symptoms include myalgias. Nothing aggravates the symptoms. She has tried nothing for the symptoms.  Pt reports no relief with monostat.   Pt also complains of pain in left wrist with movement,  No injury.   Pt worried about glucose level because her monitor read high and then 70 a few minutes later.   Pt reports she vomitted earlier today.   No abdominal pain,  Nausea is resolving  Past Medical History  Diagnosis Date  . Hypertension   . Diabetes mellitus   . Headache     Past Surgical History  Procedure Date  . Breast lumpectomy   . Appendectomy   . Abdominal hysterectomy   . Cesarean section     Family History  Problem Relation Age of Onset  . Hypertension Father   . Stroke Father   . Cancer Sister 57    breast  . Diabetes Brother   . Cancer Paternal Aunt     breast  . Diabetes Paternal Aunt   . Cancer Maternal Grandmother     breast    History  Substance Use Topics  . Smoking status: Never Smoker   . Smokeless tobacco: Never Used  . Alcohol Use: No    OB History    Grav Para Term Preterm Abortions TAB SAB Ect Mult Living   2 2 2       2       Review of Systems  Genitourinary: Positive for vaginal discharge.  Musculoskeletal: Positive for myalgias.  All other systems reviewed and are negative.    Allergies  Penicillins  Home Medications   Current Outpatient Rx  Name Route Sig  Dispense Refill  . INSULIN GLARGINE 100 UNIT/ML [place] SOLN Subcutaneous Inject 20 Units into the skin at bedtime.     Marland Kitchen LISINOPRIL 20 MG PO TABS Oral Take 20 mg by mouth 2 (two) times daily.      Marland Kitchen METOPROLOL TARTRATE 50 MG PO TABS Oral Take 50 mg by mouth 2 (two) times daily.      . OXYCODONE-ACETAMINOPHEN 5-500 MG PO TABS Oral Take 1 tablet by mouth every 4 (four) hours as needed. For pain.      BP 143/86  Pulse 89  Temp 98.2 F (36.8 C) (Oral)  Resp 16  Ht 5\' 8"  (1.727 m)  Wt 340 lb (154.223 kg)  BMI 51.70 kg/m2  SpO2 100%  Physical Exam  Constitutional: She is oriented to person, place, and time. She appears well-developed and well-nourished.  HENT:  Head: Normocephalic.  Eyes: Pupils are equal, round, and reactive to light.  Cardiovascular: Normal rate and regular rhythm.   Pulmonary/Chest: Effort normal and breath sounds normal.  Abdominal: Soft. Bowel sounds are normal.  Genitourinary: Vaginal discharge found.  Musculoskeletal: Normal range of motion.       Tender left wrist,  No deformity,  No swelling  Neurological: She is alert and oriented to person, place, and time. She  has normal reflexes.  Skin: Skin is warm.  Psychiatric: She has a normal mood and affect.    ED Course  Procedures (including critical care time)  Labs Reviewed  URINALYSIS, ROUTINE W REFLEX MICROSCOPIC - Abnormal; Notable for the following:    APPearance CLOUDY (*)     Leukocytes, UA MODERATE (*)     All other components within normal limits  BASIC METABOLIC PANEL - Abnormal; Notable for the following:    GFR calc non Af Amer 86 (*)     All other components within normal limits  WET PREP, GENITAL - Abnormal; Notable for the following:    Clue Cells Wet Prep HPF POC MODERATE (*)     WBC, Wet Prep HPF POC MODERATE (*)     All other components within normal limits  URINE MICROSCOPIC-ADD ON - Abnormal; Notable for the following:    Squamous Epithelial / LPF FEW (*)     All other components  within normal limits  CBC WITH DIFFERENTIAL  GC/CHLAMYDIA PROBE AMP, GENITAL   No results found.   1. BV (bacterial vaginosis)   2. Tendonitis of wrist, left       MDM  rx for flagyl and hydrocodone        Lonia Skinner [place], [place] 08/15/12 2007

## 2012-08-15 NOTE — ED Notes (Signed)
States she is having dysuria and thinks she has a yeast infection. Pelvic cart in the room and set up for pelvic exam.  Pt c.o pain in her left wrist. Pain improves with wearing a ace wrap. No known injury

## 2012-08-15 NOTE — ED Notes (Signed)
Pt c/o vaginal itching x 2 weeks and left wrist pain x 1 week.

## 2012-08-15 NOTE — ED Notes (Signed)
Pt wants a pain pill for left wrist pain

## 2012-08-16 LAB — GC/CHLAMYDIA PROBE AMP, GENITAL: Chlamydia, DNA Probe: NEGATIVE

## 2012-08-16 NOTE — ED Provider Notes (Signed)
Medical screening examination/treatment/procedure(s) were performed by non-physician practitioner and as supervising physician I was immediately available for consultation/collaboration.   Bryana Froemming, MD 08/16/12 2243 

## 2012-09-08 ENCOUNTER — Emergency Department (HOSPITAL_BASED_OUTPATIENT_CLINIC_OR_DEPARTMENT_OTHER)
Admission: EM | Admit: 2012-09-08 | Discharge: 2012-09-08 | Disposition: A | Discharge status: Home / Self Care | Payer: Medicaid Other | Attending: Emergency Medicine | Admitting: Emergency Medicine

## 2012-09-08 ENCOUNTER — Encounter (HOSPITAL_BASED_OUTPATIENT_CLINIC_OR_DEPARTMENT_OTHER): Payer: Self-pay | Admitting: *Deleted

## 2012-09-08 DIAGNOSIS — B3731 Acute candidiasis of vulva and vagina: Secondary | ICD-10-CM | POA: Insufficient documentation

## 2012-09-08 DIAGNOSIS — B373 Candidiasis of vulva and vagina: Secondary | ICD-10-CM | POA: Insufficient documentation

## 2012-09-08 DIAGNOSIS — B9689 Other specified bacterial agents as the cause of diseases classified elsewhere: Secondary | ICD-10-CM | POA: Insufficient documentation

## 2012-09-08 DIAGNOSIS — Z8249 Family history of ischemic heart disease and other diseases of the circulatory system: Secondary | ICD-10-CM | POA: Insufficient documentation

## 2012-09-08 DIAGNOSIS — N760 Acute vaginitis: Secondary | ICD-10-CM | POA: Insufficient documentation

## 2012-09-08 DIAGNOSIS — I10 Essential (primary) hypertension: Secondary | ICD-10-CM | POA: Insufficient documentation

## 2012-09-08 DIAGNOSIS — Z833 Family history of diabetes mellitus: Secondary | ICD-10-CM | POA: Insufficient documentation

## 2012-09-08 DIAGNOSIS — Z823 Family history of stroke: Secondary | ICD-10-CM | POA: Insufficient documentation

## 2012-09-08 DIAGNOSIS — Z880 Allergy status to penicillin: Secondary | ICD-10-CM | POA: Insufficient documentation

## 2012-09-08 DIAGNOSIS — A499 Bacterial infection, unspecified: Secondary | ICD-10-CM | POA: Insufficient documentation

## 2012-09-08 DIAGNOSIS — E119 Type 2 diabetes mellitus without complications: Secondary | ICD-10-CM | POA: Insufficient documentation

## 2012-09-08 DIAGNOSIS — Z803 Family history of malignant neoplasm of breast: Secondary | ICD-10-CM | POA: Insufficient documentation

## 2012-09-08 LAB — URINALYSIS, ROUTINE W REFLEX MICROSCOPIC
Nitrite: NEGATIVE
Specific Gravity, Urine: 1.027 (ref 1.005–1.030)
Urobilinogen, UA: 1 mg/dL (ref 0.0–1.0)
pH: 6 (ref 5.0–8.0)

## 2012-09-08 LAB — WET PREP, GENITAL

## 2012-09-08 LAB — URINE MICROSCOPIC-ADD ON

## 2012-09-08 MED ORDER — NITROFURANTOIN MONOHYD MACRO 100 MG PO CAPS
100.0000 mg | ORAL_CAPSULE | Freq: Once | ORAL | Status: AC
  Administered 2012-09-08: 100 mg via ORAL
  Filled 2012-09-08: qty 1

## 2012-09-08 MED ORDER — NITROFURANTOIN MONOHYD MACRO 100 MG PO CAPS: 100.0000 mg | ORAL_CAPSULE | Freq: Two times a day (BID) | ORAL | Status: AC

## 2012-09-08 MED ORDER — FLUCONAZOLE 50 MG PO TABS
150.0000 mg | ORAL_TABLET | Freq: Once | ORAL | Status: AC
  Administered 2012-09-08: 150 mg via ORAL
  Filled 2012-09-08: qty 1

## 2012-09-08 MED ORDER — METRONIDAZOLE 500 MG PO TABS
500.0000 mg | ORAL_TABLET | Freq: Once | ORAL | Status: AC
  Administered 2012-09-08: 500 mg via ORAL
  Filled 2012-09-08: qty 1

## 2012-09-08 MED ORDER — METRONIDAZOLE 500 MG PO TABS: 500.0000 mg | ORAL_TABLET | Freq: Two times a day (BID) | ORAL | Status: AC

## 2012-09-08 NOTE — ED Notes (Signed)
Pt amb to room 5 with quick steady gait in nad. Pt reports one month of vaginal itching, denies any discharge or bleeding, seen here 2 weeks ago and dx with bacterial vaginosis, finished atbx and still itching per pt.

## 2012-09-08 NOTE — ED Provider Notes (Signed)
History     CSN: [id number]  Arrival date & time 09/08/12  0915   First MD Initiated Contact with Patient 09/08/12 [phone]      Chief Complaint  Patient presents with  . Vaginal Itching    HPI The patient presents with concerns of ongoing vaginal discomfort, itching and mild dysuria there she notes that she was treated recently for a yeast infection and bacterial vaginosis.  She notes that since the treatment she continues to have persistent discomfort, with no new fevers, discharge, nausea, vomiting, diarrhea, hematuria.  The patient's that she is compliant with all medications, including insulin for her diabetes.  Now she has completed her antibiotics, she notes no new medications, and there are no clear alleviating or exacerbating factors. Past Medical History  Diagnosis Date  . Hypertension   . Diabetes mellitus   . Headache     Past Surgical History  Procedure Date  . Breast lumpectomy   . Appendectomy   . Abdominal hysterectomy   . Cesarean section     Family History  Problem Relation Age of Onset  . Hypertension Father   . Stroke Father   . Cancer Sister 90    breast  . Diabetes Brother   . Cancer Paternal Aunt     breast  . Diabetes Paternal Aunt   . Cancer Maternal Grandmother     breast    History  Substance Use Topics  . Smoking status: Never Smoker   . Smokeless tobacco: Never Used  . Alcohol Use: No    OB History    Grav Para Term Preterm Abortions TAB SAB Ect Mult Living   2 2 2       2       Review of Systems  Constitutional:       HPI  HENT:       HPI otherwise negative  Eyes: Negative.   Respiratory:       HPI, otherwise negative  Cardiovascular:       HPI, otherwise nmegative  Gastrointestinal: Negative for vomiting.  Genitourinary:       HPI, otherwise negative  Musculoskeletal:       HPI, otherwise negative  Skin: Negative.   Neurological: Negative for syncope.    Allergies  Penicillins  Home Medications   Current  Outpatient Rx  Name Route Sig Dispense Refill  . INSULIN GLARGINE 100 UNIT/ML [place] SOLN Subcutaneous Inject 20 Units into the skin at bedtime.     Marland Kitchen LISINOPRIL 20 MG PO TABS Oral Take 20 mg by mouth 2 (two) times daily.      Marland Kitchen METOPROLOL TARTRATE 50 MG PO TABS Oral Take 50 mg by mouth 2 (two) times daily.      . OXYCODONE-ACETAMINOPHEN 5-500 MG PO TABS Oral Take 1 tablet by mouth every 4 (four) hours as needed. For pain.      BP 137/76  Pulse 72  Temp 98.2 F (36.8 C) (Oral)  Resp 18  Ht 5\' 8"  (1.727 m)  Wt 375 lb (170.099 kg)  BMI 57.02 kg/m2  SpO2 100%  Physical Exam  Nursing note and vitals reviewed. Constitutional: No distress.       Morbidly obese female resting comfortably in bed  HENT:  Head: Normocephalic and atraumatic.  Eyes: Conjunctivae normal and EOM are normal.  Neck: No tracheal deviation present.  Cardiovascular: Normal rate and regular rhythm.   Pulmonary/Chest: Effort normal. No stridor. No respiratory distress.  Abdominal: Soft. She exhibits no distension. There is  no tenderness. There is no rebound and no guarding. Hernia confirmed negative in the right inguinal area and confirmed negative in the left inguinal area.  Genitourinary: Pelvic exam was performed with patient supine. There is no rash, lesion or injury on the right labia. There is no rash, lesion or injury on the left labia. Cervix exhibits discharge. Cervix exhibits no motion tenderness and no friability. Right adnexum displays no mass and no tenderness. Left adnexum displays no mass and no tenderness. No erythema, tenderness or bleeding around the vagina. No foreign body around the vagina. No signs of injury around the vagina. Vaginal discharge found.  Lymphadenopathy:       Right: No inguinal adenopathy present.       Left: No inguinal adenopathy present.  Skin: She is not diaphoretic.    ED Course  Procedures (including critical care time)  Labs Reviewed  WET PREP, GENITAL - Abnormal; Notable  for the following:    Yeast Wet Prep HPF POC FEW (*)     Clue Cells Wet Prep HPF POC MODERATE (*)     WBC, Wet Prep HPF POC MANY (*)     All other components within normal limits  URINALYSIS, ROUTINE W REFLEX MICROSCOPIC - Abnormal; Notable for the following:    Leukocytes, UA MODERATE (*)     All other components within normal limits  URINE MICROSCOPIC-ADD ON - Abnormal; Notable for the following:    Squamous Epithelial / LPF FEW (*)     Bacteria, UA MANY (*)     All other components within normal limits  PREGNANCY, URINE  GC/CHLAMYDIA PROBE AMP, GENITAL   No results found.   1. Bacterial vaginosis   2. Yeast infection of the vagina       MDM  This female presents with concerns of ongoing vaginal discomfort.  On exam she is in no distress, though there is a mild amount of discharge, his physical exam findings consistent with yeast infection.  Absent distress, the patient was provided an initial dose of antibiotics, antifungals here discharged after a conversation here about the need for primary care and gynecologic followup.   Gerhard Munch, MD 09/08/12 1114

## 2012-09-09 LAB — GC/CHLAMYDIA PROBE AMP, GENITAL
Chlamydia, DNA Probe: NEGATIVE
GC Probe Amp, Genital: NEGATIVE

## 2012-10-03 ENCOUNTER — Encounter (HOSPITAL_BASED_OUTPATIENT_CLINIC_OR_DEPARTMENT_OTHER): Payer: Self-pay

## 2012-10-03 ENCOUNTER — Emergency Department (HOSPITAL_BASED_OUTPATIENT_CLINIC_OR_DEPARTMENT_OTHER)
Admission: EM | Admit: 2012-10-03 | Discharge: 2012-10-03 | Disposition: A | Discharge status: Home / Self Care | Payer: Medicaid Other | Attending: Emergency Medicine | Admitting: Emergency Medicine

## 2012-10-03 DIAGNOSIS — I10 Essential (primary) hypertension: Secondary | ICD-10-CM | POA: Insufficient documentation

## 2012-10-03 DIAGNOSIS — K047 Periapical abscess without sinus: Secondary | ICD-10-CM | POA: Insufficient documentation

## 2012-10-03 DIAGNOSIS — E119 Type 2 diabetes mellitus without complications: Secondary | ICD-10-CM | POA: Insufficient documentation

## 2012-10-03 LAB — GLUCOSE, CAPILLARY

## 2012-10-03 MED ORDER — CLINDAMYCIN HCL 150 MG PO CAPS
300.0000 mg | ORAL_CAPSULE | Freq: Once | ORAL | Status: AC
  Administered 2012-10-03: 300 mg via ORAL
  Filled 2012-10-03: qty 2

## 2012-10-03 MED ORDER — OXYCODONE-ACETAMINOPHEN 5-325 MG PO TABS: 1.0000 | ORAL_TABLET | Freq: Four times a day (QID) | ORAL | Status: DC | PRN

## 2012-10-03 MED ORDER — OXYCODONE-ACETAMINOPHEN 5-325 MG PO TABS
1.0000 | ORAL_TABLET | Freq: Once | ORAL | Status: AC
  Administered 2012-10-03: 1 via ORAL
  Filled 2012-10-03 (×2): qty 1

## 2012-10-03 MED ORDER — CLINDAMYCIN HCL 300 MG PO CAPS: 300.0000 mg | ORAL_CAPSULE | Freq: Four times a day (QID) | ORAL | Status: DC

## 2012-10-03 NOTE — ED Provider Notes (Signed)
History     CSN: [id number]  Arrival date & time 10/03/12  1724   First MD Initiated Contact with Patient 10/03/12 1735      Chief Complaint  Patient presents with  . Dental Pain    (Consider location/radiation/quality/duration/timing/severity/associated sxs/prior treatment) HPI Pt presents with c/o dental pain.  She states pain is in left lower tooth and gum.  Started approx 2 days ago, today she noted swelling of her gum.  No fever/chills.  No difficutly breathing or swallowing.  Has not tried anything for her pain.  States she is diabetic but that her blood sugar has not been elevated.  There are no other associated systemic symptoms, there are no other alleviating or modifying factors.   Past Medical History  Diagnosis Date  . Hypertension   . Diabetes mellitus   . Headache     Past Surgical History  Procedure Date  . Breast lumpectomy   . Appendectomy   . Abdominal hysterectomy   . Cesarean section     Family History  Problem Relation Age of Onset  . Hypertension Father   . Stroke Father   . Cancer Sister 44    breast  . Diabetes Brother   . Cancer Paternal Aunt     breast  . Diabetes Paternal Aunt   . Cancer Maternal Grandmother     breast    History  Substance Use Topics  . Smoking status: Never Smoker   . Smokeless tobacco: Never Used  . Alcohol Use: No    OB History    Grav Para Term Preterm Abortions TAB SAB Ect Mult Living   2 2 2       2       Review of Systems ROS reviewed and all otherwise negative except for mentioned in HPI  Allergies  Penicillins  Home Medications   Current Outpatient Rx  Name Route Sig Dispense Refill  . CLINDAMYCIN HCL 300 MG PO CAPS Oral Take 1 capsule (300 mg total) by mouth every 6 (six) hours. 28 capsule 0  . INSULIN GLARGINE 100 UNIT/ML [place] SOLN Subcutaneous Inject 20 Units into the skin at bedtime.     Marland Kitchen LISINOPRIL 20 MG PO TABS Oral Take 20 mg by mouth 2 (two) times daily.      Marland Kitchen METOPROLOL TARTRATE 50  MG PO TABS Oral Take 50 mg by mouth 2 (two) times daily.      . OXYCODONE-ACETAMINOPHEN 5-325 MG PO TABS Oral Take 1-2 tablets by mouth every 6 (six) hours as needed for pain. 15 tablet 0  . OXYCODONE-ACETAMINOPHEN 5-500 MG PO TABS Oral Take 1 tablet by mouth every 4 (four) hours as needed. For pain.      BP 122/86  Pulse 79  Temp 98.6 F (37 C) (Oral)  Resp 18  Ht 5\' 8"  (1.727 m)  Wt 340 lb (154.223 kg)  BMI 51.70 kg/m2  SpO2 100% Vitals reviewed Physical Exam Physical Examination: General appearance - alert, well appearing, and in no distress Mental status - alert, oriented to person, place, and time Eyes - no conjunctival injection, no scleral icterus Mouth - mucous membranes moist, pharynx normal without lesions, left lower molar with decay as well as gingival redness, no sublingular swelling, OP clear and symmetric Chest - clear to auscultation, no wheezes, rales or rhonchi, symmetric air entry Heart - normal rate, regular rhythm, normal S1, S2, no murmurs, rubs, clicks or gallops Extremities - peripheral pulses normal, no pedal edema, no clubbing or cyanosis  Skin - normal coloration and turgor, no rashes  ED Course  Procedures (including critical care time)   Labs Reviewed  GLUCOSE, CAPILLARY   No results found.   1. Dental abscess       MDM  Pt presenting with dental pain. Dental abscess is likely.  Pt has well controlled blood sugar.  Pt started on antibiotics, pain medications.  Given information for dental followup.  Discharged with strict return precautions.  Pt agreeable with plan.        Ethelda Chick, MD 10/05/12 [phone]

## 2012-10-03 NOTE — ED Notes (Signed)
Dental pain x 2 days with possible abscess.

## 2012-11-04 ENCOUNTER — Emergency Department (HOSPITAL_COMMUNITY)
Admission: EM | Admit: 2012-11-04 | Discharge: 2012-11-04 | Disposition: A | Discharge status: Home / Self Care | Payer: Medicaid Other | Attending: Emergency Medicine | Admitting: Emergency Medicine

## 2012-11-04 ENCOUNTER — Encounter (HOSPITAL_COMMUNITY): Payer: Self-pay | Admitting: Emergency Medicine

## 2012-11-04 DIAGNOSIS — R51 Headache: Secondary | ICD-10-CM | POA: Insufficient documentation

## 2012-11-04 DIAGNOSIS — Z794 Long term (current) use of insulin: Secondary | ICD-10-CM | POA: Insufficient documentation

## 2012-11-04 DIAGNOSIS — M25519 Pain in unspecified shoulder: Secondary | ICD-10-CM | POA: Insufficient documentation

## 2012-11-04 DIAGNOSIS — I10 Essential (primary) hypertension: Secondary | ICD-10-CM | POA: Insufficient documentation

## 2012-11-04 DIAGNOSIS — E119 Type 2 diabetes mellitus without complications: Secondary | ICD-10-CM | POA: Insufficient documentation

## 2012-11-04 LAB — URINALYSIS, ROUTINE W REFLEX MICROSCOPIC
Bilirubin Urine: NEGATIVE
Glucose, UA: NEGATIVE mg/dL
Hgb urine dipstick: NEGATIVE
Leukocytes, UA: NEGATIVE
Nitrite: NEGATIVE
Protein, ur: NEGATIVE mg/dL
Specific Gravity, Urine: 1.029 (ref 1.005–1.030)
Urobilinogen, UA: 0.2 mg/dL (ref 0.0–1.0)
pH: 5 (ref 5.0–8.0)

## 2012-11-04 LAB — GLUCOSE, CAPILLARY: Glucose-Capillary: 122 mg/dL — ABNORMAL HIGH (ref 70–99)

## 2012-11-04 MED ORDER — IBUPROFEN 800 MG PO TABS: 800.0000 mg | ORAL_TABLET | Freq: Three times a day (TID) | ORAL | Status: DC | PRN

## 2012-11-04 MED ORDER — ONDANSETRON HCL 4 MG/2ML IJ SOLN: 4.0000 mg | Freq: Once | INTRAMUSCULAR | Status: DC

## 2012-11-04 MED ORDER — OXYCODONE-ACETAMINOPHEN 5-325 MG PO TABS: 1.0000 | ORAL_TABLET | Freq: Four times a day (QID) | ORAL | Status: DC | PRN

## 2012-11-04 MED ORDER — MORPHINE SULFATE 4 MG/ML IJ SOLN: 4.0000 mg | Freq: Once | INTRAMUSCULAR | Status: DC

## 2012-11-04 MED ORDER — OXYCODONE-ACETAMINOPHEN 5-325 MG PO TABS
1.0000 | ORAL_TABLET | Freq: Once | ORAL | Status: AC
  Administered 2012-11-04: 1 via ORAL
  Filled 2012-11-04: qty 1

## 2012-11-04 NOTE — ED Notes (Signed)
EKG given to EDP, Palumbo,MD. 

## 2012-11-04 NOTE — ED Notes (Signed)
Pt ambulated to restroom and was asked to give [place] a sample, specimen cup provided.

## 2012-11-04 NOTE — ED Notes (Signed)
Pt alert, arrives from home, c/o multiple c/o, headache, body aches, nausea, resp even unlabored, skin pwd

## 2012-11-04 NOTE — ED Notes (Signed)
Pt states that an hour ago she started feeling nauseated. Pt feels like she's going to lose consciousness.

## 2012-11-04 NOTE — ED Notes (Signed)
MD made aware of the difficulty with carrying out orders due to patient refusing iv, not walking to bathroom ect.

## 2012-11-04 NOTE — ED Provider Notes (Signed)
Medical screening examination/treatment/procedure(s) were performed by non-physician practitioner and as supervising physician I was immediately available for consultation/collaboration.   Neizan Debruhl, MD 11/04/12 1511 

## 2012-11-04 NOTE — ED Notes (Signed)
Pt is aware of the need for a urine sample was asked by Porfirio Mylar NT

## 2012-11-04 NOTE — ED Provider Notes (Signed)
History     CSN: [id number]  Arrival date & time 11/04/12  0510   First MD Initiated Contact with Patient 11/04/12 [phone]      Chief Complaint  Patient presents with  . Generalized Body Aches    (Consider location/radiation/quality/duration/timing/severity/associated sxs/prior treatment) HPI Pt presents to the ER with bilateral arm pain.  The pain begins at the shoulders and radiates down the arms.  It is worse on the right side.  Pain in the right arm began 6 days ago.  Pain in the left arm began yesterday.  The pain is constant and achy.  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  Pt states that it hurts to move her arms.  She has taken no medication to alleviate the pain.  Pt denies fever, numbness tingling, limb swelling, leg pain, chest pain, neck pain, shortness of breath,  Nausea, vomiting,  and headache.  Past Medical History  Diagnosis Date  . Hypertension   . Diabetes mellitus   . Headache     Past Surgical History  Procedure Date  . Breast lumpectomy   . Appendectomy   . Abdominal hysterectomy   . Cesarean section     Family History  Problem Relation Age of Onset  . Hypertension Father   . Stroke Father   . Cancer Sister 29    breast  . Diabetes Brother   . Cancer Paternal Aunt     breast  . Diabetes Paternal Aunt   . Cancer Maternal Grandmother     breast    History  Substance Use Topics  . Smoking status: Never Smoker   . Smokeless tobacco: Never Used  . Alcohol Use: No    OB History    Grav Para Term Preterm Abortions TAB SAB Ect Mult Living   2 2 2       2       Review of Systems All other systems negative except as documented in the HPI. All pertinent positives and negatives as reviewed in the HPI.   Allergies  Shellfish allergy and Penicillins  Home Medications   Current Outpatient Rx  Name  Route  Sig  Dispense  Refill  . LISINOPRIL 20 MG PO TABS   Oral   Take 20 mg by mouth 2 (two) times daily.           Marland Kitchen METOPROLOL TARTRATE 50 MG PO TABS   Oral   Take 50 mg by mouth 2 (two) times daily.           . OXYCODONE-ACETAMINOPHEN 5-325 MG PO TABS   Oral   Take 1-2 tablets by mouth every 6 (six) hours as needed for pain.   15 tablet   0   . INSULIN GLARGINE 100 UNIT/ML [place] SOLN   Subcutaneous   Inject 20 Units into the skin at bedtime.            BP 140/83  Pulse 79  Temp 98.4 F (36.9 C) (Oral)  Resp 16  SpO2 99%  Physical Exam  Constitutional: She is oriented to person, place, and time. She appears well-developed and well-nourished.  HENT:  Head: Normocephalic and atraumatic.  Neck: Normal range of motion.  Cardiovascular: Normal rate, regular rhythm and normal heart sounds.   Pulmonary/Chest: Effort normal and breath sounds normal.  Musculoskeletal: Normal range of motion. She exhibits no edema.       Right shoulder: She exhibits tenderness. She exhibits normal range  of motion, no bony tenderness, no swelling, no crepitus and no deformity.       Left shoulder: She exhibits tenderness. She exhibits normal range of motion, no bony tenderness, no swelling, no crepitus and no deformity.       Right hand: She exhibits normal capillary refill. normal sensation noted. Normal strength noted.       Left hand: She exhibits normal capillary refill. normal sensation noted. Normal strength noted.  Neurological: She is alert and oriented to person, place, and time.  Skin: Skin is warm and dry.    ED Course  Procedures (including critical care time)  Labs Reviewed  URINALYSIS, ROUTINE W REFLEX MICROSCOPIC - Abnormal; Notable for the following:    Ketones, ur TRACE (*)     All other components within normal limits  GLUCOSE, CAPILLARY - Abnormal; Notable for the following:    Glucose-Capillary 122 (*)     All other components within normal limits  BASIC METABOLIC PANEL   The patient has no other symptoms such as chest pain, SOB, or exertional symptoms. The patient is advised to  return here as needed. Follow up with her doctor. There is no motor or sensory deficits on exam   MDM  MDM Reviewed: nursing note and vitals Interpretation: labs and ECG    Date: 11/04/2012  Rate: 74  Rhythm: normal sinus rhythm  QRS Axis: normal  Intervals: normal  ST/T Wave abnormalities: nonspecific T wave changes  Conduction Disutrbances:none  Narrative Interpretation:   Old EKG Reviewed: unchanged           Carlyle Dolly, PA-C 11/04/12 1023  Carlyle Dolly, PA-C 11/04/12 1023  Carlyle Dolly, PA-C 11/04/12 1024

## 2012-11-23 ENCOUNTER — Encounter (HOSPITAL_BASED_OUTPATIENT_CLINIC_OR_DEPARTMENT_OTHER): Payer: Self-pay | Admitting: *Deleted

## 2012-11-23 ENCOUNTER — Emergency Department (HOSPITAL_BASED_OUTPATIENT_CLINIC_OR_DEPARTMENT_OTHER)
Admission: EM | Admit: 2012-11-23 | Discharge: 2012-11-23 | Disposition: A | Discharge status: Home / Self Care | Payer: Medicaid Other | Attending: Emergency Medicine | Admitting: Emergency Medicine

## 2012-11-23 DIAGNOSIS — K029 Dental caries, unspecified: Secondary | ICD-10-CM

## 2012-11-23 DIAGNOSIS — Z79899 Other long term (current) drug therapy: Secondary | ICD-10-CM | POA: Insufficient documentation

## 2012-11-23 DIAGNOSIS — I10 Essential (primary) hypertension: Secondary | ICD-10-CM | POA: Insufficient documentation

## 2012-11-23 DIAGNOSIS — Z794 Long term (current) use of insulin: Secondary | ICD-10-CM | POA: Insufficient documentation

## 2012-11-23 DIAGNOSIS — Z8669 Personal history of other diseases of the nervous system and sense organs: Secondary | ICD-10-CM | POA: Insufficient documentation

## 2012-11-23 DIAGNOSIS — E119 Type 2 diabetes mellitus without complications: Secondary | ICD-10-CM | POA: Insufficient documentation

## 2012-11-23 MED ORDER — OXYCODONE-ACETAMINOPHEN 5-325 MG PO TABS: 1.0000 | ORAL_TABLET | Freq: Four times a day (QID) | ORAL | Status: DC | PRN

## 2012-11-23 MED ORDER — HYDROMORPHONE HCL PF 2 MG/ML IJ SOLN
1.0000 mg | Freq: Once | INTRAMUSCULAR | Status: AC
  Administered 2012-11-23: 1 mg via INTRAMUSCULAR
  Filled 2012-11-23: qty 1

## 2012-11-23 MED ORDER — NAPROXEN 500 MG PO TABS: 500.0000 mg | ORAL_TABLET | Freq: Two times a day (BID) | ORAL | Status: DC

## 2012-11-23 MED ORDER — CLINDAMYCIN HCL 300 MG PO CAPS: 300.0000 mg | ORAL_CAPSULE | Freq: Three times a day (TID) | ORAL | Status: DC

## 2012-11-23 NOTE — ED Notes (Signed)
Left lower tooth pain x1day.

## 2012-11-23 NOTE — ED Provider Notes (Signed)
History     CSN: [id number] Arrival date & time 11/23/12  1459 First MD Initiated Contact with Patient 11/23/12 1518      Chief Complaint  Patient presents with  . Dental Pain    HPI The patient presents to the emergency room with complaints of a toothache. She was seen last month in emergency department for a toothache as well. She went and saw a dentist recently and was started on antibiotics and Tylenol with codeine. Patient states the tooth pain has been increasing especially over the last day. She took a friend's hydrocodone but that still was not helping. Patient denies any fevers or coughing. She denies any difficulty with breathing Past Medical History  Diagnosis Date  . Hypertension   . Diabetes mellitus   . Headache     Past Surgical History  Procedure Date  . Breast lumpectomy   . Appendectomy   . Abdominal hysterectomy   . Cesarean section     Family History  Problem Relation Age of Onset  . Hypertension Father   . Stroke Father   . Cancer Sister 13    breast  . Diabetes Brother   . Cancer Paternal Aunt     breast  . Diabetes Paternal Aunt   . Cancer Maternal Grandmother     breast    History  Substance Use Topics  . Smoking status: Never Smoker   . Smokeless tobacco: Never Used  . Alcohol Use: No    OB History    Grav Para Term Preterm Abortions TAB SAB Ect Mult Living   2 2 2       2       Review of Systems  All other systems reviewed and are negative.    Allergies  Shellfish allergy; Benadryl; and Penicillins  Home Medications   Current Outpatient Rx  Name  Route  Sig  Dispense  Refill  . CLINDAMYCIN HCL PO   Oral   Take by mouth.         . INSULIN GLARGINE 100 UNIT/ML [place] SOLN   Subcutaneous   Inject 20 Units into the skin at bedtime.          Marland Kitchen LISINOPRIL 20 MG PO TABS   Oral   Take 20 mg by mouth 2 (two) times daily.           Marland Kitchen METOPROLOL TARTRATE 50 MG PO TABS   Oral   Take 50 mg by mouth 2 (two) times daily.             . IBUPROFEN 800 MG PO TABS   Oral   Take 1 tablet (800 mg total) by mouth every 8 (eight) hours as needed for pain.   21 tablet   0   . OXYCODONE-ACETAMINOPHEN 5-325 MG PO TABS   Oral   Take 1-2 tablets by mouth every 6 (six) hours as needed for pain.   15 tablet   0   . OXYCODONE-ACETAMINOPHEN 5-325 MG PO TABS   Oral   Take 1 tablet by mouth every 6 (six) hours as needed for pain.   15 tablet   0     Pulse 68  Temp 99.3 F (37.4 C) (Oral)  Resp 20  Ht 5\' 8"  (1.727 m)  Wt 375 lb (170.099 kg)  BMI 57.02 kg/m2  SpO2 99%  Physical Exam  Nursing note and vitals reviewed. Constitutional: She appears well-developed and well-nourished. No distress.  HENT:  Head: Normocephalic and atraumatic. No trismus  in the jaw.  Right Ear: External ear normal.  Left Ear: External ear normal.  Mouth/Throat: Dental caries present. No dental abscesses, uvula swelling or lacerations. No oropharyngeal exudate.  Eyes: Conjunctivae normal are normal. Right eye exhibits no discharge. Left eye exhibits no discharge. No scleral icterus.  Neck: Neck supple. No tracheal deviation present.  Cardiovascular: Normal rate.   Pulmonary/Chest: Effort normal. No stridor. No respiratory distress.  Musculoskeletal: She exhibits no edema.  Lymphadenopathy:    She has no cervical adenopathy.  Neurological: She is alert. Cranial nerve deficit: no gross deficits.  Skin: Skin is warm and dry. No rash noted.  Psychiatric: She has a normal mood and affect.    ED Course  Procedures (including critical care time)  Labs Reviewed - No data to display No results found.    MDM  Patient has a large cavity in the lower left molar region. There is no swelling. She does have tenderness to palpation at region. Patient was given an injection of Dilaudid for pain. She was discharged home on a prescription for 100 And anti-inflammatory agents. I will have her continue her clindamycin antibiotic.  She is  instructed to try to call her dentist for help as soon as possible. At this time there does not appear to be any evidence of obvious abscess or infection     Celene Kras, MD 11/23/12 1541

## 2013-01-04 ENCOUNTER — Encounter (HOSPITAL_BASED_OUTPATIENT_CLINIC_OR_DEPARTMENT_OTHER): Payer: Self-pay

## 2013-01-04 ENCOUNTER — Emergency Department (HOSPITAL_BASED_OUTPATIENT_CLINIC_OR_DEPARTMENT_OTHER)
Admission: EM | Admit: 2013-01-04 | Discharge: 2013-01-04 | Disposition: A | Discharge status: Home / Self Care | Payer: Medicaid Other | Attending: Emergency Medicine | Admitting: Emergency Medicine

## 2013-01-04 DIAGNOSIS — Z79899 Other long term (current) drug therapy: Secondary | ICD-10-CM | POA: Insufficient documentation

## 2013-01-04 DIAGNOSIS — M7500 Adhesive capsulitis of unspecified shoulder: Secondary | ICD-10-CM | POA: Insufficient documentation

## 2013-01-04 DIAGNOSIS — I10 Essential (primary) hypertension: Secondary | ICD-10-CM | POA: Insufficient documentation

## 2013-01-04 DIAGNOSIS — E119 Type 2 diabetes mellitus without complications: Secondary | ICD-10-CM | POA: Insufficient documentation

## 2013-01-04 DIAGNOSIS — Z8679 Personal history of other diseases of the circulatory system: Secondary | ICD-10-CM | POA: Insufficient documentation

## 2013-01-04 DIAGNOSIS — Z794 Long term (current) use of insulin: Secondary | ICD-10-CM | POA: Insufficient documentation

## 2013-01-04 MED ORDER — HYDROCODONE-ACETAMINOPHEN 5-325 MG PO TABS: 2.0000 | ORAL_TABLET | ORAL | Status: DC | PRN

## 2013-01-04 NOTE — ED Provider Notes (Signed)
History     CSN: [id number]  Arrival date & time 01/04/13  1451   First MD Initiated Contact with Patient 01/04/13 1502      Chief Complaint  Patient presents with  . Arm Pain    (Consider location/radiation/quality/duration/timing/severity/associated sxs/prior treatment) HPI Comments: Pt states that she has had right arm pain for 4 months:pt states that she can't rotate her shoulder behind her back and above her head:pt denies fever:pt states that she has also noticed a bump in the right vaginal area and she is not sure if there is still something there because she can't feel it anymore  The history is provided by the patient. No language interpreter was used.    Past Medical History  Diagnosis Date  . Hypertension   . Diabetes mellitus   . Headache     Past Surgical History  Procedure Date  . Breast lumpectomy   . Appendectomy   . Abdominal hysterectomy   . Cesarean section     Family History  Problem Relation Age of Onset  . Hypertension Father   . Stroke Father   . Cancer Sister 3    breast  . Diabetes Brother   . Cancer Paternal Aunt     breast  . Diabetes Paternal Aunt   . Cancer Maternal Grandmother     breast    History  Substance Use Topics  . Smoking status: Never Smoker   . Smokeless tobacco: Never Used  . Alcohol Use: No    OB History    Grav Para Term Preterm Abortions TAB SAB Ect Mult Living   2 2 2       2       Review of Systems  Constitutional: Negative.   Respiratory: Negative.     Allergies  Shellfish allergy; Benadryl; and Penicillins  Home Medications   Current Outpatient Rx  Name  Route  Sig  Dispense  Refill  . CLINDAMYCIN HCL 300 MG PO CAPS   Oral   Take 1 capsule (300 mg total) by mouth 3 (three) times daily.   21 capsule   0   . CLINDAMYCIN HCL PO   Oral   Take by mouth.         . INSULIN GLARGINE 100 UNIT/ML [place] SOLN   Subcutaneous   Inject 20 Units into the skin at bedtime.          Marland Kitchen LISINOPRIL 20  MG PO TABS   Oral   Take 20 mg by mouth 2 (two) times daily.           Marland Kitchen METOPROLOL TARTRATE 50 MG PO TABS   Oral   Take 50 mg by mouth 2 (two) times daily.           Marland Kitchen NAPROXEN 500 MG PO TABS   Oral   Take 1 tablet (500 mg total) by mouth 2 (two) times daily.   30 tablet   0   . OXYCODONE-ACETAMINOPHEN 5-325 MG PO TABS   Oral   Take 1 tablet by mouth every 6 (six) hours as needed for pain.   12 tablet   0     BP 141/77  Pulse 66  Temp 98.2 F (36.8 C) (Oral)  Resp 18  Ht 5\' 8"  (1.727 m)  Wt 360 lb (163.295 kg)  BMI 54.74 kg/m2  SpO2 99%  Physical Exam  Nursing note and vitals reviewed. Constitutional: She appears well-developed and well-nourished.  Cardiovascular: Normal rate and regular rhythm.  Pulmonary/Chest: Effort normal and breath sounds normal.  Genitourinary:       No swelling or abnormality noted on labia  Musculoskeletal:       Pt unable to fully rotate the right shoulder:grip strengths equal  Neurological: She is alert.  Skin: Skin is warm and dry.    ED Course  Procedures (including critical care time)  Labs Reviewed - No data to display No results found.   1. Frozen shoulder       MDM  Will treat symptomatically and refer to dr. Melanee Left vagina findings noted at this time        Teressa Lower, NP 01/04/13 1922

## 2013-01-04 NOTE — ED Notes (Addendum)
C/o right arm pain x 4 months-denies injury-also wants to be seen for "a bump on my vagina"

## 2013-01-05 NOTE — ED Provider Notes (Signed)
Medical screening examination/treatment/procedure(s) were performed by non-physician practitioner and as supervising physician I was immediately available for consultation/collaboration.     Maurine Mowbray R Jossiah Smoak, MD 01/05/13 0003 

## 2013-01-26 ENCOUNTER — Emergency Department (HOSPITAL_BASED_OUTPATIENT_CLINIC_OR_DEPARTMENT_OTHER)
Admission: EM | Admit: 2013-01-26 | Discharge: 2013-01-26 | Disposition: A | Discharge status: Home / Self Care | Payer: Medicaid Other | Attending: Emergency Medicine | Admitting: Emergency Medicine

## 2013-01-26 ENCOUNTER — Encounter (HOSPITAL_BASED_OUTPATIENT_CLINIC_OR_DEPARTMENT_OTHER): Payer: Self-pay | Admitting: *Deleted

## 2013-01-26 DIAGNOSIS — I10 Essential (primary) hypertension: Secondary | ICD-10-CM

## 2013-01-26 DIAGNOSIS — Z79899 Other long term (current) drug therapy: Secondary | ICD-10-CM | POA: Insufficient documentation

## 2013-01-26 DIAGNOSIS — M25519 Pain in unspecified shoulder: Secondary | ICD-10-CM | POA: Insufficient documentation

## 2013-01-26 DIAGNOSIS — Z791 Long term (current) use of non-steroidal anti-inflammatories (NSAID): Secondary | ICD-10-CM | POA: Insufficient documentation

## 2013-01-26 DIAGNOSIS — E119 Type 2 diabetes mellitus without complications: Secondary | ICD-10-CM | POA: Insufficient documentation

## 2013-01-26 DIAGNOSIS — Z794 Long term (current) use of insulin: Secondary | ICD-10-CM | POA: Insufficient documentation

## 2013-01-26 DIAGNOSIS — R51 Headache: Secondary | ICD-10-CM | POA: Insufficient documentation

## 2013-01-26 DIAGNOSIS — Z760 Encounter for issue of repeat prescription: Secondary | ICD-10-CM | POA: Insufficient documentation

## 2013-01-26 MED ORDER — HYDROCODONE-ACETAMINOPHEN 5-325 MG PO TABS: 2.0000 | ORAL_TABLET | Freq: Four times a day (QID) | ORAL | Status: DC | PRN

## 2013-01-26 MED ORDER — LISINOPRIL 20 MG PO TABS: 20.0000 mg | ORAL_TABLET | Freq: Two times a day (BID) | ORAL | Status: DC

## 2013-01-26 MED ORDER — METOPROLOL TARTRATE 50 MG PO TABS: 50.0000 mg | ORAL_TABLET | Freq: Two times a day (BID) | ORAL | Status: DC

## 2013-01-26 NOTE — ED Provider Notes (Signed)
History     CSN: [id number]  Arrival date & time 01/26/13  2107   First MD Initiated Contact with Patient 01/26/13 2130      Chief Complaint  Patient presents with  . Arm Pain    (Consider location/radiation/quality/duration/timing/severity/associated sxs/prior treatment) HPI Comments: Patient with history of htn, dm, chronic shoulder pain.  Out of her bp meds and pain meds.  Doctor can't see her until march.    Patient is a 47 y.o. female presenting with arm pain. The history is provided by the patient.  Arm Pain This is a chronic problem. Episode onset: several months ago. The problem occurs constantly. The problem has not changed since onset.Pertinent negatives include no chest pain and no shortness of breath. Exacerbated by: movement, palpation. Nothing relieves the symptoms. She has tried nothing for the symptoms.    Past Medical History  Diagnosis Date  . Hypertension   . Diabetes mellitus   . Headache     Past Surgical History  Procedure Date  . Breast lumpectomy   . Appendectomy   . Abdominal hysterectomy   . Cesarean section     Family History  Problem Relation Age of Onset  . Hypertension Father   . Stroke Father   . Cancer Sister 10    breast  . Diabetes Brother   . Cancer Paternal Aunt     breast  . Diabetes Paternal Aunt   . Cancer Maternal Grandmother     breast    History  Substance Use Topics  . Smoking status: Never Smoker   . Smokeless tobacco: Never Used  . Alcohol Use: No    OB History    Grav Para Term Preterm Abortions TAB SAB Ect Mult Living   2 2 2       2       Review of Systems  Respiratory: Negative for shortness of breath.   Cardiovascular: Negative for chest pain.  All other systems reviewed and are negative.    Allergies  Shellfish allergy; Benadryl; and Penicillins  Home Medications   Current Outpatient Rx  Name  Route  Sig  Dispense  Refill  . CLINDAMYCIN HCL 300 MG PO CAPS   Oral   Take 1 capsule (300 mg  total) by mouth 3 (three) times daily.   21 capsule   0   . CLINDAMYCIN HCL PO   Oral   Take by mouth.         Marland Kitchen HYDROCODONE-ACETAMINOPHEN 5-325 MG PO TABS   Oral   Take 2 tablets by mouth every 4 (four) hours as needed for pain.   10 tablet   0   . INSULIN GLARGINE 100 UNIT/ML [place] SOLN   Subcutaneous   Inject 20 Units into the skin at bedtime.          Marland Kitchen LISINOPRIL 20 MG PO TABS   Oral   Take 20 mg by mouth 2 (two) times daily.           Marland Kitchen METOPROLOL TARTRATE 50 MG PO TABS   Oral   Take 50 mg by mouth 2 (two) times daily.           Marland Kitchen NAPROXEN 500 MG PO TABS   Oral   Take 1 tablet (500 mg total) by mouth 2 (two) times daily.   30 tablet   0   . OXYCODONE-ACETAMINOPHEN 5-325 MG PO TABS   Oral   Take 1 tablet by mouth every 6 (six) hours as needed  for pain.   12 tablet   0     BP 131/71  Pulse 68  Temp 97.8 F (36.6 C) (Oral)  Resp 20  SpO2 100%  Physical Exam  Nursing note and vitals reviewed. Constitutional: She is oriented to person, place, and time. She appears well-developed and well-nourished. No distress.  HENT:  Head: Normocephalic and atraumatic.  Neck: Normal range of motion. Neck supple.  Cardiovascular: Normal rate and regular rhythm.  Exam reveals no gallop and no friction rub.   No murmur heard. Pulmonary/Chest: Effort normal and breath sounds normal. No respiratory distress. She has no wheezes.  Abdominal: Soft. Bowel sounds are normal. She exhibits no distension. There is no tenderness.  Musculoskeletal: Normal range of motion.       There is pain with range of motion of the right shoulder.  Distally, the ulnar and radial pulses are intact.  Motor and sensory are intact as well.    Neurological: She is alert and oriented to person, place, and time.  Skin: Skin is warm and dry. She is not diaphoretic.    ED Course  Procedures (including critical care time)  Labs Reviewed - No data to display No results found.   No diagnosis  found.    MDM  Will fill bp meds and a limited supply of vicodin.  She has been advised to call her pcp to move up her appointment.          Geoffery Lyons, MD 01/26/13 2146

## 2013-01-26 NOTE — ED Notes (Addendum)
Right arm pain. States it aches. Headaches. Ran out of pain pills and BP medication but can't get a refil until March. Her doctors office advised her to come to the ED and have her Rxs filled to get her through until March.

## 2013-05-25 ENCOUNTER — Emergency Department (HOSPITAL_BASED_OUTPATIENT_CLINIC_OR_DEPARTMENT_OTHER)
Admission: EM | Admit: 2013-05-25 | Discharge: 2013-05-25 | Disposition: A | Discharge status: Home / Self Care | Payer: Medicaid Other | Attending: Emergency Medicine | Admitting: Emergency Medicine

## 2013-05-25 ENCOUNTER — Encounter (HOSPITAL_BASED_OUTPATIENT_CLINIC_OR_DEPARTMENT_OTHER): Payer: Self-pay | Admitting: *Deleted

## 2013-05-25 DIAGNOSIS — M67919 Unspecified disorder of synovium and tendon, unspecified shoulder: Secondary | ICD-10-CM | POA: Insufficient documentation

## 2013-05-25 DIAGNOSIS — Z794 Long term (current) use of insulin: Secondary | ICD-10-CM | POA: Insufficient documentation

## 2013-05-25 DIAGNOSIS — Z8679 Personal history of other diseases of the circulatory system: Secondary | ICD-10-CM | POA: Insufficient documentation

## 2013-05-25 DIAGNOSIS — M719 Bursopathy, unspecified: Secondary | ICD-10-CM | POA: Insufficient documentation

## 2013-05-25 DIAGNOSIS — I10 Essential (primary) hypertension: Secondary | ICD-10-CM | POA: Insufficient documentation

## 2013-05-25 DIAGNOSIS — Z880 Allergy status to penicillin: Secondary | ICD-10-CM | POA: Insufficient documentation

## 2013-05-25 DIAGNOSIS — G500 Trigeminal neuralgia: Secondary | ICD-10-CM | POA: Insufficient documentation

## 2013-05-25 DIAGNOSIS — M75101 Unspecified rotator cuff tear or rupture of right shoulder, not specified as traumatic: Secondary | ICD-10-CM

## 2013-05-25 DIAGNOSIS — E119 Type 2 diabetes mellitus without complications: Secondary | ICD-10-CM | POA: Insufficient documentation

## 2013-05-25 DIAGNOSIS — Z79899 Other long term (current) drug therapy: Secondary | ICD-10-CM | POA: Insufficient documentation

## 2013-05-25 DIAGNOSIS — G8929 Other chronic pain: Secondary | ICD-10-CM | POA: Insufficient documentation

## 2013-05-25 MED ORDER — OXYCODONE-ACETAMINOPHEN 5-325 MG PO TABS: 1.0000 | ORAL_TABLET | Freq: Four times a day (QID) | ORAL | Status: DC | PRN

## 2013-05-25 MED ORDER — OXYCODONE-ACETAMINOPHEN 5-325 MG PO TABS
1.0000 | ORAL_TABLET | Freq: Once | ORAL | Status: AC
  Administered 2013-05-25: 1 via ORAL
  Filled 2013-05-25 (×2): qty 1

## 2013-05-25 NOTE — ED Notes (Signed)
Pt reports she is out of percocet that she takes for her arm pain.

## 2013-05-25 NOTE — ED Notes (Signed)
Pt c/o a HA that started at about 10:00 last night and chronic right arm pain.

## 2013-05-25 NOTE — ED Provider Notes (Addendum)
History     CSN: [id number]  Arrival date & time 05/25/13  0241   None     Chief Complaint  Patient presents with  . Headache    (Consider location/radiation/quality/duration/timing/severity/associated sxs/prior treatment) HPI This is a 47 year old female with a history of migraines. She is here with pain in her right temporal region that began about 8 PM yesterday evening. The pain is sharp and stabbing and lasts for a minute or less. It is intermittent. There does not seem to be any specific trigger. It was somewhat eased with a cold rag earlier. It is not like her migraines and is not associated with nausea or photophobia. She also has chronic right shoulder pain and a "frozen shoulder" for which followup is pending next month. She denies any new neurologic deficits apart from the pain in her head.  Past Medical History  Diagnosis Date  . Hypertension   . Diabetes mellitus   . NWGNFAOZ(308.6)     Past Surgical History  Procedure Laterality Date  . Breast lumpectomy    . Appendectomy    . Abdominal hysterectomy    . Cesarean section      Family History  Problem Relation Age of Onset  . Hypertension Father   . Stroke Father   . Cancer Sister 63    breast  . Diabetes Brother   . Cancer Paternal Aunt     breast  . Diabetes Paternal Aunt   . Cancer Maternal Grandmother     breast    History  Substance Use Topics  . Smoking status: Never Smoker   . Smokeless tobacco: Never Used  . Alcohol Use: No    OB History   Grav Para Term Preterm Abortions TAB SAB Ect Mult Living   2 2 2       2       Review of Systems  All other systems reviewed and are negative.    Allergies  Shellfish allergy; Benadryl; and Penicillins  Home Medications   Current Outpatient Rx  Name  Route  Sig  Dispense  Refill  . clindamycin (CLEOCIN) 300 MG capsule   Oral   Take 1 capsule (300 mg total) by mouth 3 (three) times daily.   21 capsule   0   . CLINDAMYCIN HCL PO    Oral   Take by mouth.         Marland Kitchen HYDROcodone-acetaminophen (NORCO/VICODIN) 5-325 MG per tablet   Oral   Take 2 tablets by mouth every 4 (four) hours as needed for pain.   10 tablet   0   . HYDROcodone-acetaminophen (NORCO/VICODIN) 5-325 MG per tablet   Oral   Take 2 tablets by mouth every 6 (six) hours as needed for pain.   20 tablet   0   . insulin glargine (LANTUS) 100 UNIT/ML injection   Subcutaneous   Inject 20 Units into the skin at bedtime.          Marland Kitchen lisinopril (PRINIVIL,ZESTRIL) 20 MG tablet   Oral   Take 20 mg by mouth 2 (two) times daily.           Marland Kitchen lisinopril (PRINIVIL,ZESTRIL) 20 MG tablet   Oral   Take 1 tablet (20 mg total) by mouth 2 (two) times daily.   60 tablet   1   . metoprolol (LOPRESSOR) 50 MG tablet   Oral   Take 50 mg by mouth 2 (two) times daily.           Marland Kitchen  metoprolol (LOPRESSOR) 50 MG tablet   Oral   Take 1 tablet (50 mg total) by mouth 2 (two) times daily.   60 tablet   1   . naproxen (NAPROSYN) 500 MG tablet   Oral   Take 1 tablet (500 mg total) by mouth 2 (two) times daily.   30 tablet   0   . oxyCODONE-acetaminophen (PERCOCET) 5-325 MG per tablet   Oral   Take 1 tablet by mouth every 6 (six) hours as needed for pain.   12 tablet   0     BP 134/72  Pulse 65  Temp(Src) 98.1 F (36.7 C) (Oral)  Resp 20  Ht 5\' 8"  (1.727 m)  Wt 350 lb (158.759 kg)  BMI 53.23 kg/m2  SpO2 100%  Physical Exam General: Well-developed, well-nourished female in no acute distress; appearance consistent with age of record HENT: normocephalic, atraumatic; scalp and face nontender Eyes: pupils equal round and reactive to light; extraocular muscles intact Neck: supple; nontender Heart: regular rate and rhythm Lungs: clear to auscultation bilaterally Abdomen: soft; nondistended Extremities: Decreased range of motion of right shoulder with pain on attempted range of motion Neurologic: Awake, alert and oriented; motor function intact in all  extremities and symmetric; no facial droop Skin: Warm and dry Psychiatric: Normal mood and affect    ED Course  Procedures (including critical care time)    MDM  Symptoms concerning for her trigeminal neuralgia. We will treat with pain medication and outpatient followup with her PCP.         Hanley Seamen, MD 05/25/13 [id number]  Hanley Seamen, MD 05/25/13 1191

## 2013-06-30 ENCOUNTER — Emergency Department (HOSPITAL_BASED_OUTPATIENT_CLINIC_OR_DEPARTMENT_OTHER)
Admission: EM | Admit: 2013-06-30 | Discharge: 2013-06-30 | Disposition: A | Discharge status: Home / Self Care | Payer: Medicaid Other | Attending: Emergency Medicine | Admitting: Emergency Medicine

## 2013-06-30 ENCOUNTER — Ambulatory Visit (HOSPITAL_BASED_OUTPATIENT_CLINIC_OR_DEPARTMENT_OTHER)
Admit: 2013-06-30 | Discharge: 2013-06-30 | Disposition: A | Discharge status: Home / Self Care | Payer: Medicaid Other | Attending: Emergency Medicine | Admitting: Emergency Medicine

## 2013-06-30 ENCOUNTER — Encounter (HOSPITAL_BASED_OUTPATIENT_CLINIC_OR_DEPARTMENT_OTHER): Payer: Self-pay | Admitting: Emergency Medicine

## 2013-06-30 DIAGNOSIS — E119 Type 2 diabetes mellitus without complications: Secondary | ICD-10-CM | POA: Insufficient documentation

## 2013-06-30 DIAGNOSIS — R609 Edema, unspecified: Secondary | ICD-10-CM | POA: Insufficient documentation

## 2013-06-30 DIAGNOSIS — I10 Essential (primary) hypertension: Secondary | ICD-10-CM | POA: Insufficient documentation

## 2013-06-30 DIAGNOSIS — Z79899 Other long term (current) drug therapy: Secondary | ICD-10-CM | POA: Insufficient documentation

## 2013-06-30 DIAGNOSIS — M79609 Pain in unspecified limb: Secondary | ICD-10-CM | POA: Insufficient documentation

## 2013-06-30 DIAGNOSIS — Z794 Long term (current) use of insulin: Secondary | ICD-10-CM | POA: Insufficient documentation

## 2013-06-30 DIAGNOSIS — R600 Localized edema: Secondary | ICD-10-CM

## 2013-06-30 DIAGNOSIS — Z880 Allergy status to penicillin: Secondary | ICD-10-CM | POA: Insufficient documentation

## 2013-06-30 LAB — CBC WITH DIFFERENTIAL/PLATELET
Basophils Relative: 0 % (ref 0–1)
Hemoglobin: 14.2 g/dL (ref 12.0–15.0)
Lymphocytes Relative: 37 % (ref 12–46)
MCHC: 35.4 g/dL (ref 30.0–36.0)
Monocytes Relative: 11 % (ref 3–12)
Neutro Abs: 4.1 10*3/uL (ref 1.7–7.7)
Neutrophils Relative %: 50 % (ref 43–77)
RBC: 4.62 MIL/uL (ref 3.87–5.11)
WBC: 8.1 10*3/uL (ref 4.0–10.5)

## 2013-06-30 LAB — BASIC METABOLIC PANEL
BUN: 10 mg/dL (ref 6–23)
CO2: 25 mEq/L (ref 19–32)
Chloride: 102 mEq/L (ref 96–112)
GFR calc Af Amer: >90 mL/min (ref >90)
Potassium: 3.9 mEq/L (ref 3.5–5.1)

## 2013-06-30 LAB — D-DIMER, QUANTITATIVE: D-Dimer, Quant: 0.35 ug/mL-FEU (ref 0.00–0.48)

## 2013-06-30 MED ORDER — HYDROCODONE-ACETAMINOPHEN 5-325 MG PO TABS
2.0000 | ORAL_TABLET | Freq: Once | ORAL | Status: AC
  Administered 2013-06-30: 2 via ORAL
  Filled 2013-06-30: qty 2

## 2013-06-30 MED ORDER — HYDROCODONE-ACETAMINOPHEN 5-325 MG PO TABS: 2.0000 | ORAL_TABLET | Freq: Four times a day (QID) | ORAL | Status: AC | PRN

## 2013-06-30 MED ORDER — ENOXAPARIN SODIUM 150 MG/ML ~~LOC~~ SOLN
1.0000 mg/kg | Freq: Once | SUBCUTANEOUS | Status: AC
  Administered 2013-06-30: 160 mg via SUBCUTANEOUS
  Filled 2013-06-30: qty 2

## 2013-06-30 NOTE — ED Notes (Signed)
Pt reports that she woke up this am and her legs were swollen, denies any swelling on normal basis, reports recent extended travel in car 1 1/2 days ago

## 2013-06-30 NOTE — ED Notes (Signed)
Pt reports that she awoke this am unable to ambulate due to swelling in her ble's, reports that they are tight and painful, upon assesment their is trace pedal edema, pt is able to ambulate to bed without assistance, pt denies redness, cramps, does report recent travel in car >5 hr, when asked where she traveled to, she stated she drove around town for her mom, went to [place], travel was not non stop + cms

## 2013-06-30 NOTE — ED Provider Notes (Signed)
History    CSN: [id number] Arrival date & time 06/30/13  0239  First MD Initiated Contact with Patient 06/30/13 0259     Chief Complaint  Patient presents with  . Leg Swelling   (Consider location/radiation/quality/duration/timing/severity/associated sxs/prior Treatment) HPI Pt presents complaining of about 24hrs of bilateral LE edema and aching pain. She reports she woke up with it yesterday morning and it has persisted despite resting with her feet up during the day today. Denies history of same, denies CP or SOB. She reports she recently was in the car for about 5hrs driving her mother around town, although she herself did not get out of the car during that time. She denies recent cancer treatment. Symptoms are not unilateral.   Past Medical History  Diagnosis Date  . Hypertension   . Diabetes mellitus   . NWGNFAOZ(308.6)    Past Surgical History  Procedure Laterality Date  . Breast lumpectomy    . Appendectomy    . Abdominal hysterectomy    . Cesarean section     Family History  Problem Relation Age of Onset  . Hypertension Father   . Stroke Father   . Cancer Sister 96    breast  . Diabetes Brother   . Cancer Paternal Aunt     breast  . Diabetes Paternal Aunt   . Cancer Maternal Grandmother     breast   History  Substance Use Topics  . Smoking status: Never Smoker   . Smokeless tobacco: Never Used  . Alcohol Use: No   OB History   Grav Para Term Preterm Abortions TAB SAB Ect Mult Living   2 2 2       2      Review of Systems All other systems reviewed and are negative except as noted in HPI.   Allergies  Shellfish allergy; Benadryl; and Penicillins  Home Medications   Current Outpatient Rx  Name  Route  Sig  Dispense  Refill  . insulin glargine (LANTUS) 100 UNIT/ML injection   Subcutaneous   Inject 20 Units into the skin at bedtime.          . metoprolol (LOPRESSOR) 50 MG tablet   Oral   Take 1 tablet (50 mg total) by mouth 2 (two) times  daily.   60 tablet   1   . clindamycin (CLEOCIN) 300 MG capsule   Oral   Take 1 capsule (300 mg total) by mouth 3 (three) times daily.   21 capsule   0   . CLINDAMYCIN HCL PO   Oral   Take by mouth.         Marland Kitchen HYDROcodone-acetaminophen (NORCO/VICODIN) 5-325 MG per tablet   Oral   Take 2 tablets by mouth every 4 (four) hours as needed for pain.   10 tablet   0   . HYDROcodone-acetaminophen (NORCO/VICODIN) 5-325 MG per tablet   Oral   Take 2 tablets by mouth every 6 (six) hours as needed for pain.   20 tablet   0   . lisinopril (PRINIVIL,ZESTRIL) 20 MG tablet   Oral   Take 20 mg by mouth 2 (two) times daily.           Marland Kitchen lisinopril (PRINIVIL,ZESTRIL) 20 MG tablet   Oral   Take 1 tablet (20 mg total) by mouth 2 (two) times daily.   60 tablet   1   . metoprolol (LOPRESSOR) 50 MG tablet   Oral   Take 50 mg by mouth 2 (  two) times daily.           . naproxen (NAPROSYN) 500 MG tablet   Oral   Take 1 tablet (500 mg total) by mouth 2 (two) times daily.   30 tablet   0   . oxyCODONE-acetaminophen (PERCOCET) 5-325 MG per tablet   Oral   Take 1 tablet by mouth every 6 (six) hours as needed for pain.   20 tablet   0    BP 144/92  Pulse 89  Temp(Src) 98.3 F (36.8 C) (Oral)  Resp 18  Wt 356 lb 0.7 oz (161.5 kg)  BMI 54.15 kg/m2  SpO2 96% Physical Exam  Nursing note and vitals reviewed. Constitutional: She is oriented to person, place, and time. She appears well-developed and well-nourished.  Morbidly obese  HENT:  Head: Normocephalic and atraumatic.  Eyes: EOM are normal. Pupils are equal, round, and reactive to light.  Neck: Normal range of motion. Neck supple.  Cardiovascular: Normal rate, normal heart sounds and intact distal pulses.   Pulmonary/Chest: Effort normal and breath sounds normal.  Abdominal: Bowel sounds are normal. She exhibits no distension. There is no tenderness.  Musculoskeletal: Normal range of motion. She exhibits edema (bilateral 2+  from knees to ankles) and tenderness (over the medial thigh bilaterally).  Neurological: She is alert and oriented to person, place, and time. She has normal strength. No cranial nerve deficit or sensory deficit.  Skin: Skin is warm and dry. No rash noted.  Psychiatric: She has a normal mood and affect.    ED Course  Procedures (including critical care time) Labs Reviewed  BASIC METABOLIC PANEL - Abnormal; Notable for the following:    Glucose, Bld 141 (*)    GFR calc non Af Amer 86 (*)    All other components within normal limits  CBC WITH DIFFERENTIAL  D-DIMER, QUANTITATIVE   No results found. 1. Peripheral edema     MDM  Difficult to apply Well's DVT criteria due to bilateral symptoms, no history of DVT but reports a period of prolonged immobility while driving recently. She is tender over the deep vein system bilaterally, but also has chronic pain in several areas. [place] not available at this hour, will check basic labs to ensure normal renal function and plan for Lovenox with [place] in the AM to rule out DVT.   Charles B. Bernette Mayers, MD 06/30/13 [phone]

## 2013-08-09 ENCOUNTER — Other Ambulatory Visit: Payer: Self-pay | Admitting: Internal Medicine

## 2013-08-09 DIAGNOSIS — N644 Mastodynia: Secondary | ICD-10-CM

## 2013-09-04 ENCOUNTER — Other Ambulatory Visit: Payer: Medicaid Other

## 2014-02-25 ENCOUNTER — Emergency Department (HOSPITAL_BASED_OUTPATIENT_CLINIC_OR_DEPARTMENT_OTHER)
Admission: EM | Admit: 2014-02-25 | Discharge: 2014-02-25 | Disposition: A | Discharge status: Home / Self Care | Payer: Medicaid Other | Attending: Emergency Medicine | Admitting: Emergency Medicine

## 2014-02-25 ENCOUNTER — Encounter (HOSPITAL_BASED_OUTPATIENT_CLINIC_OR_DEPARTMENT_OTHER): Payer: Self-pay | Admitting: Emergency Medicine

## 2014-02-25 DIAGNOSIS — Z794 Long term (current) use of insulin: Secondary | ICD-10-CM | POA: Insufficient documentation

## 2014-02-25 DIAGNOSIS — K529 Noninfective gastroenteritis and colitis, unspecified: Secondary | ICD-10-CM

## 2014-02-25 DIAGNOSIS — Z9089 Acquired absence of other organs: Secondary | ICD-10-CM | POA: Insufficient documentation

## 2014-02-25 DIAGNOSIS — Z90710 Acquired absence of both cervix and uterus: Secondary | ICD-10-CM | POA: Insufficient documentation

## 2014-02-25 DIAGNOSIS — K5289 Other specified noninfective gastroenteritis and colitis: Secondary | ICD-10-CM | POA: Insufficient documentation

## 2014-02-25 DIAGNOSIS — I10 Essential (primary) hypertension: Secondary | ICD-10-CM | POA: Insufficient documentation

## 2014-02-25 DIAGNOSIS — E119 Type 2 diabetes mellitus without complications: Secondary | ICD-10-CM | POA: Insufficient documentation

## 2014-02-25 DIAGNOSIS — Z9889 Other specified postprocedural states: Secondary | ICD-10-CM | POA: Insufficient documentation

## 2014-02-25 DIAGNOSIS — Z79899 Other long term (current) drug therapy: Secondary | ICD-10-CM | POA: Insufficient documentation

## 2014-02-25 DIAGNOSIS — Z880 Allergy status to penicillin: Secondary | ICD-10-CM | POA: Insufficient documentation

## 2014-02-25 LAB — CBC WITH DIFFERENTIAL/PLATELET
BASOS ABS: 0 10*3/uL (ref 0.0–0.1)
BASOS PCT: 0 % (ref 0–1)
EOS ABS: 0.3 10*3/uL (ref 0.0–0.7)
Eosinophils Relative: 4 % (ref 0–5)
HEMATOCRIT: 42.9 % (ref 36.0–46.0)
Hemoglobin: 14.7 g/dL (ref 12.0–15.0)
Lymphocytes Relative: 47 % — ABNORMAL HIGH (ref 12–46)
Lymphs Abs: 2.9 10*3/uL (ref 0.7–4.0)
MCH: 30.3 pg (ref 26.0–34.0)
MCHC: 34.3 g/dL (ref 30.0–36.0)
MCV: 88.5 fL (ref 78.0–100.0)
MONO ABS: 0.5 10*3/uL (ref 0.1–1.0)
Monocytes Relative: 8 % (ref 3–12)
NEUTROS ABS: 2.6 10*3/uL (ref 1.7–7.7)
Neutrophils Relative %: 41 % — ABNORMAL LOW (ref 43–77)
Platelets: 196 10*3/uL (ref 150–400)
RBC: 4.85 MIL/uL (ref 3.87–5.11)
RDW: 13.2 % (ref 11.5–15.5)
WBC: 6.3 10*3/uL (ref 4.0–10.5)

## 2014-02-25 LAB — COMPREHENSIVE METABOLIC PANEL
ALT: 15 U/L (ref 0–35)
AST: 18 U/L (ref 0–37)
Albumin: 3.6 g/dL (ref 3.5–5.2)
Alkaline Phosphatase: 68 U/L (ref 39–117)
BUN: 7 mg/dL (ref 6–23)
CALCIUM: 9.3 mg/dL (ref 8.4–10.5)
CO2: 23 meq/L (ref 19–32)
CREATININE: 0.8 mg/dL (ref 0.50–1.10)
Chloride: 106 mEq/L (ref 96–112)
GFR calc Af Amer: >90 mL/min (ref >90)
GFR, EST NON AFRICAN AMERICAN: 86 mL/min — AB (ref >90)
GLUCOSE: 97 mg/dL (ref 70–99)
Potassium: 4.1 mEq/L (ref 3.7–5.3)
SODIUM: 143 meq/L (ref 137–147)
TOTAL PROTEIN: 7.9 g/dL (ref 6.0–8.3)
Total Bilirubin: 0.3 mg/dL (ref 0.3–1.2)

## 2014-02-25 LAB — URINALYSIS, ROUTINE W REFLEX MICROSCOPIC
Bilirubin Urine: NEGATIVE
Glucose, UA: NEGATIVE mg/dL
Hgb urine dipstick: NEGATIVE
Ketones, ur: 15 mg/dL — AB
Leukocytes, UA: NEGATIVE
Nitrite: NEGATIVE
Protein, ur: NEGATIVE mg/dL
Specific Gravity, Urine: 1.024 (ref 1.005–1.030)
Urobilinogen, UA: 1 mg/dL (ref 0.0–1.0)
pH: 5.5 (ref 5.0–8.0)

## 2014-02-25 MED ORDER — ACETAMINOPHEN 500 MG PO TABS
1000.0000 mg | ORAL_TABLET | Freq: Once | ORAL | Status: DC
  Filled 2014-02-25: qty 2

## 2014-02-25 MED ORDER — DICYCLOMINE HCL 10 MG PO CAPS
10.0000 mg | ORAL_CAPSULE | Freq: Once | ORAL | Status: AC
  Administered 2014-02-25: 10 mg via ORAL
  Filled 2014-02-25: qty 1

## 2014-02-25 MED ORDER — ONDANSETRON 8 MG PO TBDP: ORAL_TABLET | ORAL | Status: AC

## 2014-02-25 MED ORDER — ONDANSETRON 8 MG PO TBDP
8.0000 mg | ORAL_TABLET | Freq: Once | ORAL | Status: AC
  Administered 2014-02-25: 8 mg via ORAL
  Filled 2014-02-25: qty 1

## 2014-02-25 NOTE — ED Provider Notes (Signed)
CSN: [id number]     Arrival date & time 02/25/14  1933 History  This chart was scribed for Malvin Johns, MD by Marcha Dutton, ED Scribe. This patient was seen in room MH10/MH10 and the patient's care was started at 8:31 PM.    Chief Complaint  Patient presents with  . Emesis      The history is provided by the patient. No language interpreter was used.  HPI Comments: [name] is a 48 y.o. female who presents to the Emergency Department complaining of emesis that began 2 days ago around 8PM after eating at [facility] continuing through today including 2 episodes today. Pt also reports associated abdominal cramps, diarrhea, nausea, headache, and chills. Pt reports she is unsure if she had a fever. She states she attempted to drink ginger ale to hydrate and reduce nausea but experience minimal relief. Pt denies blood in vomit, cough, congestion, and dysuria. Pt reports h/o appendectomy, partial hysterectomy, and both of her children as cesarean section.  Past Medical History  Diagnosis Date  . Hypertension   . Diabetes mellitus   . MOQHUTML(465.0)     Past Surgical History  Procedure Laterality Date  . Breast lumpectomy    . Appendectomy    . Abdominal hysterectomy    . Cesarean section      Family History  Problem Relation Age of Onset  . Hypertension Father   . Stroke Father   . Cancer Sister 87    breast  . Diabetes Brother   . Cancer Paternal Aunt     breast  . Diabetes Paternal Aunt   . Cancer Maternal Grandmother     breast    History  Substance Use Topics  . Smoking status: Never Smoker   . Smokeless tobacco: Never Used  . Alcohol Use: No    OB History   Grav Para Term Preterm Abortions TAB SAB Ect Mult Living   2 2 2       2      Review of Systems  Constitutional: Negative for fever, chills, diaphoresis and fatigue.  HENT: Negative for congestion, rhinorrhea and sneezing.   Eyes: Negative.   Respiratory: Negative for cough, chest  tightness and shortness of breath.   Cardiovascular: Negative for chest pain and leg swelling.  Gastrointestinal: Positive for nausea, vomiting, abdominal pain and diarrhea. Negative for blood in stool.  Genitourinary: Negative for frequency, hematuria, flank pain and difficulty urinating.  Musculoskeletal: Negative for arthralgias and back pain.  Skin: Negative for rash.  Neurological: Negative for dizziness, speech difficulty, weakness, numbness and headaches.      Allergies  Shellfish allergy; Benadryl; and Penicillins  Home Medications   Current Outpatient Rx  Name  Route  Sig  Dispense  Refill  . Cholecalciferol (VITAMIN D PO)   Oral   Take by mouth.         . METOPROLOL TARTRATE PO   Oral   Take by mouth.         Marland Kitchen SIMVASTATIN PO   Oral   Take by mouth.         Marland Kitchen HYDROcodone-acetaminophen (NORCO/VICODIN) 5-325 MG per tablet   Oral   Take 2 tablets by mouth every 6 (six) hours as needed for pain.   15 tablet   0   . insulin glargine (LANTUS) 100 UNIT/ML injection   Subcutaneous   Inject 20 Units into the skin at bedtime.          Marland Kitchen losartan (  COZAAR) 100 MG tablet   Oral   Take 100 mg by mouth daily.         . ondansetron (ZOFRAN ODT) 8 MG disintegrating tablet      8mg  ODT q4 hours prn nausea   4 tablet   0    Triage Vitals: BP 130/93  Pulse 82  Temp(Src) 98.1 F (36.7 C) (Oral)  Resp 18  Ht 5\' 8"  (1.727 m)  Wt 356 lb (161.481 kg)  BMI 54.14 kg/m2  SpO2 98%  Physical Exam  Constitutional: She is oriented to person, place, and time. She appears well-developed and well-nourished.  HENT:  Head: Normocephalic and atraumatic.  Eyes: Pupils are equal, round, and reactive to light.  Neck: Normal range of motion. Neck supple.  Cardiovascular: Normal rate, regular rhythm and normal heart sounds.   Pulmonary/Chest: Effort normal and breath sounds normal. No respiratory distress. She has no wheezes. She has no rales. She exhibits no tenderness.   Abdominal: Soft. Bowel sounds are normal. There is no tenderness. There is no rebound and no guarding.  Musculoskeletal: Normal range of motion. She exhibits no edema.  Lymphadenopathy:    She has no cervical adenopathy.  Neurological: She is alert and oriented to person, place, and time.  Skin: Skin is warm and dry. No rash noted.  Psychiatric: She has a normal mood and affect.    ED Course  Procedures (including critical care time)  DIAGNOSTIC STUDIES: Oxygen Saturation is 98% on RA, normal by my interpretation.    COORDINATION OF CARE: 8:42 PM- Discussed medication for nausea and importance of hydration. Pt advised of plan for treatment and pt agrees.    Labs Review Results for orders placed during the hospital encounter of 02/25/14  URINALYSIS, ROUTINE W REFLEX MICROSCOPIC      Result Value Ref Range   Color, Urine YELLOW  YELLOW   APPearance CLOUDY (*) CLEAR   Specific Gravity, Urine 1.024  1.005 - 1.030   pH 5.5  5.0 - 8.0   Glucose, UA NEGATIVE  NEGATIVE mg/dL   Hgb urine dipstick NEGATIVE  NEGATIVE   Bilirubin Urine NEGATIVE  NEGATIVE   Ketones, ur 15 (*) NEGATIVE mg/dL   Protein, ur NEGATIVE  NEGATIVE mg/dL   Urobilinogen, UA 1.0  0.0 - 1.0 mg/dL   Nitrite NEGATIVE  NEGATIVE   Leukocytes, UA NEGATIVE  NEGATIVE  CBC WITH DIFFERENTIAL      Result Value Ref Range   WBC 6.3  4.0 - 10.5 K/uL   RBC 4.85  3.87 - 5.11 MIL/uL   Hemoglobin 14.7  12.0 - 15.0 g/dL   HCT 42.9  36.0 - 46.0 %   MCV 88.5  78.0 - 100.0 fL   MCH 30.3  26.0 - 34.0 pg   MCHC 34.3  30.0 - 36.0 g/dL   RDW 13.2  11.5 - 15.5 %   Platelets 196  150 - 400 K/uL   Neutrophils Relative % 41 (*) 43 - 77 %   Lymphocytes Relative 47 (*) 12 - 46 %   Monocytes Relative 8  3 - 12 %   Eosinophils Relative 4  0 - 5 %   Basophils Relative 0  0 - 1 %   Neutro Abs 2.6  1.7 - 7.7 K/uL   Lymphs Abs 2.9  0.7 - 4.0 K/uL   Monocytes Absolute 0.5  0.1 - 1.0 K/uL   Eosinophils Absolute 0.3  0.0 - 0.7 K/uL    Basophils Absolute 0.0  0.0 -  0.1 K/uL   WBC Morphology WHITE COUNT CONFIRMED ON SMEAR     Smear Review LARGE PLATELETS PRESENT    COMPREHENSIVE METABOLIC PANEL      Result Value Ref Range   Sodium 143  137 - 147 mEq/L   Potassium 4.1  3.7 - 5.3 mEq/L   Chloride 106  96 - 112 mEq/L   CO2 23  19 - 32 mEq/L   Glucose, Bld 97  70 - 99 mg/dL   BUN 7  6 - 23 mg/dL   Creatinine, Ser 0.80  0.50 - 1.10 mg/dL   Calcium 9.3  8.4 - 10.5 mg/dL   Total Protein 7.9  6.0 - 8.3 g/dL   Albumin 3.6  3.5 - 5.2 g/dL   AST 18  0 - 37 U/L   ALT 15  0 - 35 U/L   Alkaline Phosphatase 68  39 - 117 U/L   Total Bilirubin 0.3  0.3 - 1.2 mg/dL   GFR calc non Af Amer 86 (*) >90 mL/min   GFR calc Af Amer >90  >90 mL/min   No results found.   Imaging Review No results found.   EKG Interpretation None      MDM   Final diagnoses:  Gastroenteritis    Patient presents with vomiting and diarrhea. Her symptoms have improved today. She was given a dose of Zofran in the ED and has had no episodes of vomiting. She has no abdominal tenderness on exam. I feel her symptoms are likely viral in origin. She was given a prescription for Zofran. She was encouraged to return if her symptoms worsen.  I personally performed the services described in this documentation, which was scribed in my presence.  The recorded information has been reviewed and considered.    Malvin Johns, MD 02/25/14 [phone]

## 2014-02-25 NOTE — Discharge Instructions (Signed)
Viral Gastroenteritis Viral gastroenteritis is also known as stomach flu. This condition affects the stomach and intestinal tract. It can cause sudden diarrhea and vomiting. The illness typically lasts 3 to 8 days. Most people develop an immune response that eventually gets rid of the virus. While this natural response develops, the virus can make you quite ill. CAUSES  Many different viruses can cause gastroenteritis, such as rotavirus or noroviruses. You can catch one of these viruses by consuming contaminated food or water. You may also catch a virus by sharing utensils or other personal items with an infected person or by touching a contaminated surface. SYMPTOMS  The most common symptoms are diarrhea and vomiting. These problems can cause a severe loss of body fluids (dehydration) and a body salt (electrolyte) imbalance. Other symptoms may include:  Fever.  Headache.  Fatigue.  Abdominal pain. DIAGNOSIS  Your caregiver can usually diagnose viral gastroenteritis based on your symptoms and a physical exam. A stool sample may also be taken to test for the presence of viruses or other infections. TREATMENT  This illness typically goes away on its own. Treatments are aimed at rehydration. The most serious cases of viral gastroenteritis involve vomiting so severely that you are not able to keep fluids down. In these cases, fluids must be given through an intravenous line (IV). HOME CARE INSTRUCTIONS   Drink enough fluids to keep your urine clear or pale yellow. Drink small amounts of fluids frequently and increase the amounts as tolerated.  Ask your caregiver for specific rehydration instructions.  Avoid:  Foods high in sugar.  Alcohol.  Carbonated drinks.  Tobacco.  Juice.  Caffeine drinks.  Extremely hot or cold fluids.  Fatty, greasy foods.  Too much intake of anything at one time.  Dairy products until 24 to 48 hours after diarrhea stops.  You may consume probiotics.  Probiotics are active cultures of beneficial bacteria. They may lessen the amount and number of diarrheal stools in adults. Probiotics can be found in yogurt with active cultures and in supplements.  Wash your hands well to avoid spreading the virus.  Only take over-the-counter or prescription medicines for pain, discomfort, or fever as directed by your caregiver. Do not give aspirin to children. Antidiarrheal medicines are not recommended.  Ask your caregiver if you should continue to take your regular prescribed and over-the-counter medicines.  Keep all follow-up appointments as directed by your caregiver. SEEK IMMEDIATE MEDICAL CARE IF:   You are unable to keep fluids down.  You do not urinate at least once every 6 to 8 hours.  You develop shortness of breath.  You notice blood in your stool or vomit. This may look like coffee grounds.  You have abdominal pain that increases or is concentrated in one small area (localized).  You have persistent vomiting or diarrhea.  You have a fever.  The patient is a child younger than 3 months, and he or she has a fever.  The patient is a child older than 3 months, and he or she has a fever and persistent symptoms.  The patient is a child older than 3 months, and he or she has a fever and symptoms suddenly get worse.  The patient is a baby, and he or she has no tears when crying. MAKE SURE YOU:   Understand these instructions.  Will watch your condition.  Will get help right away if you are not doing well or get worse. Document Released: 12/13/2005 Document Revised: 03/06/2012 Document Reviewed: 09/29/2011   [employer] Patient Information 2014 [employer]  

## 2014-02-25 NOTE — ED Notes (Signed)
Vomiting, diarrhea x 2 days.

## 2014-05-17 ENCOUNTER — Inpatient Hospital Stay: Admission: RE | Admit: 2014-05-17 | Payer: Medicaid Other | Source: Ambulatory Visit

## 2014-05-17 ENCOUNTER — Other Ambulatory Visit: Payer: Medicaid Other

## 2014-05-27 ENCOUNTER — Emergency Department (HOSPITAL_BASED_OUTPATIENT_CLINIC_OR_DEPARTMENT_OTHER)
Admission: EM | Admit: 2014-05-27 | Discharge: 2014-05-28 | Disposition: A | Discharge status: Home / Self Care | Payer: Medicaid Other | Attending: Emergency Medicine | Admitting: Emergency Medicine

## 2014-05-27 ENCOUNTER — Encounter (HOSPITAL_BASED_OUTPATIENT_CLINIC_OR_DEPARTMENT_OTHER): Payer: Self-pay | Admitting: Emergency Medicine

## 2014-05-27 DIAGNOSIS — I10 Essential (primary) hypertension: Secondary | ICD-10-CM | POA: Insufficient documentation

## 2014-05-27 DIAGNOSIS — Z79899 Other long term (current) drug therapy: Secondary | ICD-10-CM | POA: Insufficient documentation

## 2014-05-27 DIAGNOSIS — R209 Unspecified disturbances of skin sensation: Secondary | ICD-10-CM | POA: Insufficient documentation

## 2014-05-27 DIAGNOSIS — H60399 Other infective otitis externa, unspecified ear: Secondary | ICD-10-CM | POA: Insufficient documentation

## 2014-05-27 DIAGNOSIS — E119 Type 2 diabetes mellitus without complications: Secondary | ICD-10-CM | POA: Insufficient documentation

## 2014-05-27 DIAGNOSIS — Z880 Allergy status to penicillin: Secondary | ICD-10-CM | POA: Insufficient documentation

## 2014-05-27 DIAGNOSIS — R0789 Other chest pain: Secondary | ICD-10-CM

## 2014-05-27 DIAGNOSIS — Z794 Long term (current) use of insulin: Secondary | ICD-10-CM | POA: Insufficient documentation

## 2014-05-27 DIAGNOSIS — H6092 Unspecified otitis externa, left ear: Secondary | ICD-10-CM

## 2014-05-27 DIAGNOSIS — R109 Unspecified abdominal pain: Secondary | ICD-10-CM | POA: Insufficient documentation

## 2014-05-27 DIAGNOSIS — R071 Chest pain on breathing: Secondary | ICD-10-CM | POA: Insufficient documentation

## 2014-05-27 LAB — URINALYSIS, ROUTINE W REFLEX MICROSCOPIC
Bilirubin Urine: NEGATIVE
Glucose, UA: NEGATIVE mg/dL
Hgb urine dipstick: NEGATIVE
Ketones, ur: NEGATIVE mg/dL
LEUKOCYTES UA: NEGATIVE
Nitrite: NEGATIVE
PH: 6 (ref 5.0–8.0)
Protein, ur: NEGATIVE mg/dL
Specific Gravity, Urine: 1.018 (ref 1.005–1.030)
Urobilinogen, UA: 1 mg/dL (ref 0.0–1.0)

## 2014-05-27 NOTE — ED Provider Notes (Signed)
CSN: [id number]     Arrival date & time 05/27/14  1924 History  This chart was scribed for Alicia Fines, MD by Ladene Artist, ED Scribe. The patient was seen in room MH10/MH10. Patient's care was started at 11:54 PM.   Chief Complaint  Patient presents with  . Rib Pain    The history is provided by the patient. No language interpreter was used.   HPI Comments: CYTHINA MICKELSEN is a 48 y.o. female who presents to the Emergency Department complaining of constant bilateral anterior lower rib pain onset 3 days ago. Pain is moderate to severe and worse with palpation or movement. She denies injury to her chest or heavy lifting. She also reports L ear pain onset 1 week ago with discharge that has resolved. She also reports intermittent L leg numbness that she attributes to longstanding sciatica. She denies abdominal pain. Pt took Vicodin at 3 AM today with transient relief of her rib pain and has taken warm baths with no relief. She denies SOB, cough or fever.  Past Medical History  Diagnosis Date  . Hypertension   . Diabetes mellitus   . [id number](387.5)    Past Surgical History  Procedure Laterality Date  . Breast lumpectomy    . Appendectomy    . Abdominal hysterectomy    . Cesarean section     Family History  Problem Relation Age of Onset  . Hypertension Father   . Stroke Father   . Cancer Sister 10    breast  . Diabetes Brother   . Cancer Paternal Aunt     breast  . Diabetes Paternal Aunt   . Cancer Maternal Grandmother     breast   History  Substance Use Topics  . Smoking status: Never Smoker   . Smokeless tobacco: Never Used  . Alcohol Use: No   OB History   Grav Para Term Preterm Abortions TAB SAB Ect Mult Living   2 2 2       2      Review of Systems  HENT: Positive for ear discharge (resolved) and ear pain.   Gastrointestinal: Negative for abdominal pain.  Genitourinary: Positive for flank pain.  Neurological: Positive for numbness.  All other systems reviewed  and are negative.  Allergies  Shellfish allergy; Benadryl; and Penicillins  Home Medications   Prior to Admission medications   Medication Sig Start Date End Date Taking? Authorizing Provider  hydrochlorothiazide (HYDRODIURIL) 50 MG tablet Take 50 mg by mouth daily.   Yes Historical Provider, MD  zolpidem (AMBIEN) 5 MG tablet Take 10 mg by mouth at bedtime as needed for sleep.   Yes Historical Provider, MD  Cholecalciferol (VITAMIN D PO) Take by mouth.    Historical Provider, MD  HYDROcodone-acetaminophen (NORCO/VICODIN) 5-325 MG per tablet Take 2 tablets by mouth every 6 (six) hours as needed for pain. 06/30/13   Charles B. Karle Starch, MD  insulin glargine (LANTUS) 100 UNIT/ML injection Inject 20 Units into the skin at bedtime.     Historical Provider, MD  losartan (COZAAR) 100 MG tablet Take 100 mg by mouth daily.    Historical Provider, MD  METOPROLOL TARTRATE PO Take by mouth.    Historical Provider, MD  ondansetron (ZOFRAN ODT) 8 MG disintegrating tablet 8mg  ODT q4 hours prn nausea 02/25/14   Malvin Johns, MD  SIMVASTATIN PO Take by mouth.    Historical Provider, MD   Triage Vitals: BP 161/100  Pulse 96  Temp(Src) 98.8 F (37.1 C)  Resp 18  Ht 5\' 8"  (1.727 m)  Wt 350 lb (158.759 kg)  BMI 53.23 kg/m2  SpO2 99% Physical Exam  Nursing note and vitals reviewed. General: Well-developed, well-nourished female in no acute distress; appearance consistent with age of record HENT: normocephalic; atraumatic; edema of L auditory canal, tenderness of L external auditory canal without erythema or exudate Eyes: pupils equal, round and reactive to light; extraocular muscles intact Neck: supple Heart: regular rate and rhythm Lungs: clear to auscultation bilaterally Abdomen: soft; nondistended; nontender; bowel sounds present Chest: bilateral tenderness of lower ribs anteriorly without deformity or crepitus Extremities: No def obese rmity; full range of motion; pulses normal Neurologic: Awake,  alert and oriented; motor function intact in all extremities and symmetric; no facial droop Skin: Warm and dry Psychiatric: Normal mood and affect  ED Course  Procedures (including critical care time) DIAGNOSTIC STUDIES: Oxygen Saturation is 99% on RA, normal by my interpretation.    COORDINATION OF CARE: 11:59 PM Discussed treatment plan with pt at bedside and pt agreed to plan.  MDM   Nursing notes and vitals signs, including pulse oximetry, reviewed.  Summary of this visit's results, reviewed by myself:  Labs:  Results for orders placed during the hospital encounter of 05/27/14 (from the past 24 hour(s))  URINALYSIS, ROUTINE W REFLEX MICROSCOPIC     Status: None   Collection Time    05/27/14  7:30 PM      Result Value Ref Range   Color, Urine YELLOW  YELLOW   APPearance CLEAR  CLEAR   Specific Gravity, Urine 1.018  1.005 - 1.030   pH 6.0  5.0 - 8.0   Glucose, UA NEGATIVE  NEGATIVE mg/dL   Hgb urine dipstick NEGATIVE  NEGATIVE   Bilirubin Urine NEGATIVE  NEGATIVE   Ketones, ur NEGATIVE  NEGATIVE mg/dL   Protein, ur NEGATIVE  NEGATIVE mg/dL   Urobilinogen, UA 1.0  0.0 - 1.0 mg/dL   Nitrite NEGATIVE  NEGATIVE   Leukocytes, UA NEGATIVE  NEGATIVE    Imaging Studies: Dg Chest 2 View  05/28/2014   CLINICAL DATA:  Bilateral lower chest wall pain.  No known injury.  EXAM: CHEST  2 VIEW  COMPARISON:  11/22/2011  FINDINGS: The heart size and mediastinal contours are within normal limits. Both lungs are clear. The visualized skeletal structures are unremarkable.  IMPRESSION: No active cardiopulmonary disease.   Electronically Signed   By: Lajean Manes M.D.   On: 05/28/2014 00:30      I personally performed the services described in this documentation, which was scribed in my presence. The recorded information has been reviewed and is accurate.    Alicia Fines, MD 05/28/14 [phone]

## 2014-05-27 NOTE — ED Notes (Signed)
Pt c/o bil flank pain x 3 days denies n/v/d

## 2014-05-27 NOTE — ED Notes (Signed)
MD at bedside. 

## 2014-05-28 ENCOUNTER — Emergency Department (HOSPITAL_BASED_OUTPATIENT_CLINIC_OR_DEPARTMENT_OTHER): Payer: Medicaid Other

## 2014-05-28 MED ORDER — HYDROCODONE-ACETAMINOPHEN 5-325 MG PO TABS
1.0000 | ORAL_TABLET | Freq: Once | ORAL | Status: AC
  Administered 2014-05-28: 1 via ORAL
  Filled 2014-05-28: qty 1

## 2014-05-28 MED ORDER — HYDROCODONE-ACETAMINOPHEN 5-325 MG PO TABS: 1.0000 | ORAL_TABLET | Freq: Four times a day (QID) | ORAL | Status: AC | PRN

## 2014-05-28 MED ORDER — NEOMYCIN-COLIST-HC-THONZONIUM 3.3-3-10-0.5 MG/ML OT SUSP
4.0000 [drp] | Freq: Four times a day (QID) | OTIC | Status: DC
  Administered 2014-05-28: 4 [drp] via OTIC
  Filled 2014-05-28: qty 5

## 2014-10-11 ENCOUNTER — Other Ambulatory Visit: Payer: Self-pay | Admitting: Internal Medicine

## 2014-10-11 DIAGNOSIS — N6452 Nipple discharge: Secondary | ICD-10-CM

## 2014-10-11 DIAGNOSIS — N644 Mastodynia: Secondary | ICD-10-CM

## 2014-10-28 ENCOUNTER — Encounter (HOSPITAL_BASED_OUTPATIENT_CLINIC_OR_DEPARTMENT_OTHER): Payer: Self-pay | Admitting: Emergency Medicine

## 2014-12-25 ENCOUNTER — Other Ambulatory Visit: Payer: Self-pay | Admitting: Internal Medicine

## 2014-12-25 DIAGNOSIS — N644 Mastodynia: Secondary | ICD-10-CM

## 2015-02-08 ENCOUNTER — Emergency Department (HOSPITAL_BASED_OUTPATIENT_CLINIC_OR_DEPARTMENT_OTHER)
Admission: EM | Admit: 2015-02-08 | Discharge: 2015-02-08 | Disposition: A | Discharge status: Home / Self Care | Payer: Medicaid Other | Attending: Emergency Medicine | Admitting: Emergency Medicine

## 2015-02-08 ENCOUNTER — Encounter (HOSPITAL_BASED_OUTPATIENT_CLINIC_OR_DEPARTMENT_OTHER): Payer: Self-pay | Admitting: Emergency Medicine

## 2015-02-08 DIAGNOSIS — L292 Pruritus vulvae: Secondary | ICD-10-CM | POA: Diagnosis present

## 2015-02-08 DIAGNOSIS — B3731 Acute candidiasis of vulva and vagina: Secondary | ICD-10-CM

## 2015-02-08 DIAGNOSIS — Z880 Allergy status to penicillin: Secondary | ICD-10-CM | POA: Insufficient documentation

## 2015-02-08 DIAGNOSIS — B373 Candidiasis of vulva and vagina: Secondary | ICD-10-CM | POA: Insufficient documentation

## 2015-02-08 DIAGNOSIS — I10 Essential (primary) hypertension: Secondary | ICD-10-CM | POA: Diagnosis not present

## 2015-02-08 DIAGNOSIS — R51 Headache: Secondary | ICD-10-CM | POA: Diagnosis not present

## 2015-02-08 DIAGNOSIS — E119 Type 2 diabetes mellitus without complications: Secondary | ICD-10-CM | POA: Insufficient documentation

## 2015-02-08 DIAGNOSIS — Z79899 Other long term (current) drug therapy: Secondary | ICD-10-CM | POA: Diagnosis not present

## 2015-02-08 DIAGNOSIS — Z791 Long term (current) use of non-steroidal anti-inflammatories (NSAID): Secondary | ICD-10-CM | POA: Insufficient documentation

## 2015-02-08 LAB — URINALYSIS, ROUTINE W REFLEX MICROSCOPIC
Bilirubin Urine: NEGATIVE
Glucose, UA: >1000 mg/dL — AB
Hgb urine dipstick: NEGATIVE
Ketones, ur: 15 mg/dL — AB
Leukocytes, UA: NEGATIVE
Nitrite: NEGATIVE
Protein, ur: NEGATIVE mg/dL
SPECIFIC GRAVITY, URINE: 1.042 — AB (ref 1.005–1.030)
Urobilinogen, UA: 1 mg/dL (ref 0.0–1.0)
pH: 5 (ref 5.0–8.0)

## 2015-02-08 LAB — WET PREP, GENITAL
CLUE CELLS WET PREP: NONE SEEN
TRICH WET PREP: NONE SEEN
Yeast Wet Prep HPF POC: NONE SEEN

## 2015-02-08 LAB — URINE MICROSCOPIC-ADD ON

## 2015-02-08 MED ORDER — FLUCONAZOLE 150 MG PO TABS: 150.0000 mg | ORAL_TABLET | ORAL | Status: DC

## 2015-02-08 MED ORDER — ACETAMINOPHEN 325 MG PO TABS
650.0000 mg | ORAL_TABLET | Freq: Once | ORAL | Status: DC
  Filled 2015-02-08: qty 2

## 2015-02-08 MED ORDER — FLUCONAZOLE 50 MG PO TABS
150.0000 mg | ORAL_TABLET | Freq: Once | ORAL | Status: AC
  Administered 2015-02-08: 150 mg via ORAL
  Filled 2015-02-08 (×2): qty 1

## 2015-02-08 NOTE — ED Notes (Signed)
Carlisle Cater, PA-C, notified that pt has refused to take the Tylenol because she doesn't think it will work. She states she thinks her head hurts because her vagina/labia are so irritated and painful. No new orders at this time. Connye Burkitt, RN

## 2015-02-08 NOTE — ED Notes (Signed)
Pt report having vaginal irritation and pain onset x 5 day and is currently having headache denies ear pain

## 2015-02-08 NOTE — ED Provider Notes (Signed)
CSN: [id number]     Arrival date & time 02/08/15  1913 History   First MD Initiated Contact with Patient 02/08/15 1930     Chief Complaint  Patient presents with  . Vaginal Itching     (Consider location/radiation/quality/duration/timing/severity/associated sxs/prior Treatment) HPI Comments: Patient with history of diabetes presents with complaint of vaginal pain, itching, rash for the past 6 days. Patient states that the pain is constant but is worse when she urinates. She has increase in urination. No vaginal bleeding or discharge. No other urinary symptoms. No nausea, vomiting, or diarrhea. No abdominal pain. No fever. Patient has used over-the-counter medications including Monistat for 2 days without relief. Patient states that her sugars have been running in the 2-300 range. Patient also has mild headache. The onset of this condition was acute. The course is constant. Aggravating factors: none. Alleviating factors: none.    The history is provided by the patient.    Past Medical History  Diagnosis Date  . Hypertension   . Diabetes mellitus   . OMBTDHRC(163.8)    Past Surgical History  Procedure Laterality Date  . Breast lumpectomy    . Appendectomy    . Abdominal hysterectomy    . Cesarean section     Family History  Problem Relation Age of Onset  . Hypertension Father   . Stroke Father   . Cancer Sister 13    breast  . Diabetes Brother   . Cancer Paternal Aunt     breast  . Diabetes Paternal Aunt   . Cancer Maternal Grandmother     breast   History  Substance Use Topics  . Smoking status: Never Smoker   . Smokeless tobacco: Never Used  . Alcohol Use: No   OB History    Gravida Para Term Preterm AB TAB SAB Ectopic Multiple Living   2 2 2       2      Review of Systems  Constitutional: Negative for fever.  HENT: Negative for rhinorrhea and sore throat.   Eyes: Negative for redness.  Respiratory: Negative for cough.   Cardiovascular: Negative for chest  pain.  Gastrointestinal: Negative for nausea, vomiting, abdominal pain and diarrhea.  Genitourinary: Positive for dysuria, frequency and vaginal pain. Negative for decreased urine volume, vaginal bleeding and vaginal discharge.  Musculoskeletal: Negative for myalgias.  Skin: Negative for rash.  Neurological: Positive for headaches.    Allergies  Shellfish allergy; Benadryl; and Penicillins  Home Medications   Prior to Admission medications   Medication Sig Start Date End Date Taking? Authorizing Provider  cetirizine (ZYRTEC) 10 MG tablet Take 10 mg by mouth daily.   Yes Historical Provider, MD  Cholecalciferol (VITAMIN D PO) Take by mouth.   Yes Historical Provider, MD  diclofenac (VOLTAREN) 75 MG EC tablet Take 75 mg by mouth 2 (two) times daily.   Yes Historical Provider, MD  gabapentin (NEURONTIN) 100 MG capsule Take 100 mg by mouth 3 (three) times daily.   Yes Historical Provider, MD  hydrochlorothiazide (HYDRODIURIL) 50 MG tablet Take 50 mg by mouth daily.   Yes Historical Provider, MD  HYDROcodone-acetaminophen (NORCO/VICODIN) 5-325 MG per tablet Take 1-2 tablets by mouth every 6 (six) hours as needed for moderate pain. 05/28/14  Yes John L Molpus, MD  metFORMIN (GLUCOPHAGE) 500 MG tablet Take 500 mg by mouth 2 (two) times daily with a meal.   Yes Historical Provider, MD  METOPROLOL TARTRATE PO Take 50 mg by mouth 2 (two) times daily.  Yes Historical Provider, MD  ondansetron (ZOFRAN ODT) 8 MG disintegrating tablet 8mg  ODT q4 hours prn nausea 02/25/14  Yes Malvin Johns, MD  oxyCODONE-acetaminophen (PERCOCET/ROXICET) 5-325 MG per tablet Take 1-2 tablets by mouth every 4 (four) hours as needed for severe pain.   Yes Historical Provider, MD  phentermine 37.5 MG capsule Take 37.5 mg by mouth every morning.   Yes Historical Provider, MD  SIMVASTATIN PO Take 10 mg by mouth at bedtime.    Yes Historical Provider, MD  zolpidem (AMBIEN) 5 MG tablet Take 10 mg by mouth at bedtime as needed for  sleep.   Yes Historical Provider, MD  HYDROcodone-acetaminophen (NORCO/VICODIN) 5-325 MG per tablet Take 2 tablets by mouth every 6 (six) hours as needed for pain. 06/30/13   Charles B. Karle Starch, MD  insulin glargine (LANTUS) 100 UNIT/ML injection Inject 20 Units into the skin at bedtime.     Historical Provider, MD  losartan (COZAAR) 100 MG tablet Take 100 mg by mouth daily.    Historical Provider, MD   BP 137/92 mmHg  Pulse 77  Temp(Src) 97.9 F (36.6 C) (Oral)  Resp 18  Ht 5\' 8"  (1.727 m)  Wt 350 lb (158.759 kg)  BMI 53.23 kg/m2  SpO2 99%   Physical Exam  Constitutional: She appears well-developed and well-nourished.  HENT:  Head: Normocephalic and atraumatic.  Eyes: Conjunctivae are normal. Right eye exhibits no discharge. Left eye exhibits no discharge.  Neck: Normal range of motion. Neck supple.  Cardiovascular: Normal rate, regular rhythm and normal heart sounds.   Pulmonary/Chest: Effort normal and breath sounds normal.  Abdominal: Soft. There is no tenderness.  Genitourinary: There is rash and tenderness on the right labia. There is rash and tenderness on the left labia. Cervix exhibits no motion tenderness. Right adnexum displays no mass and no tenderness. Left adnexum displays no mass and no tenderness. There is tenderness in the vagina. No erythema or bleeding in the vagina. No foreign body around the vagina. No signs of injury around the vagina. No vaginal discharge found.  Neurological: She is alert.  Skin: Skin is warm and dry.  Psychiatric: She has a normal mood and affect.  Nursing note and vitals reviewed.   ED Course  Procedures (including critical care time) Labs Review Labs Reviewed  WET PREP, GENITAL - Abnormal; Notable for the following:    WBC, Wet Prep HPF POC FEW (*)    All other components within normal limits  URINALYSIS, ROUTINE W REFLEX MICROSCOPIC - Abnormal; Notable for the following:    Specific Gravity, Urine 1.042 (*)    Glucose, UA >1000 (*)     Ketones, ur 15 (*)    All other components within normal limits  URINE MICROSCOPIC-ADD ON - Abnormal; Notable for the following:    Bacteria, UA FEW (*)    All other components within normal limits  GC/CHLAMYDIA PROBE AMP ([place])    Imaging Review No results found.   EKG Interpretation None       8:01 PM Patient seen and examined. Work-up initiated. Medications ordered.   Vital signs reviewed and are as follows: BP 137/92 mmHg  Pulse 77  Temp(Src) 97.9 F (36.6 C) (Oral)  Resp 18  Ht 5\' 8"  (1.727 m)  Wt 350 lb (158.759 kg)  BMI 53.23 kg/m2  SpO2 99%  9:36 PM Patient refused tylenol. Exam consistent with intertrigo 2/2 vulvovaginal candidiasis.   Will treat with Diflucan every 3 days for 3 doses. Patient given first dose  in ED.  Encouraged f/u with PCP to discuss DM management.   MDM   Final diagnoses:  Vulvovaginal candidiasis   Patient with vulvovaginal irritation, suspect secondary to candidiasis in setting of poorly controlled blood sugars. Do not suspect DKA or other hyperglycemic emergency.   Carlisle Cater, PA-C 02/08/15 2139  Tanna Furry, MD 02/08/15 [phone]

## 2015-02-08 NOTE — Discharge Instructions (Signed)
Please read and follow all provided instructions.  Your diagnoses today include:  1. Vulvovaginal candidiasis     Tests performed today include:  Urine test - elevated blood sugar, no infection  Wet prep - no significant vaginal infection  Vital signs. See below for your results today.   Medications prescribed:   Diflucan - medication for yeast infection in skin  Take any prescribed medications only as directed.  Home care instructions:  Follow any educational materials contained in this packet.  BE VERY CAREFUL not to take multiple medicines containing Tylenol (also called acetaminophen). Doing so can lead to an overdose which can damage your liver and cause liver failure and possibly death.   Follow-up instructions: Please follow-up with your primary care provider in the next 3 days for further evaluation of your symptoms and to discuss your diabetes management.   Return instructions:   Please return to the Emergency Department if you experience worsening symptoms.   Please return if you have any other emergent concerns.  Additional Information:  Your vital signs today were: BP 138/72 mmHg   Pulse 78   Temp(Src) 97.9 F (36.6 C) (Oral)   Resp 16   Ht 5\' 8"  (1.727 m)   Wt 350 lb (158.759 kg)   BMI 53.23 kg/m2   SpO2 100% If your blood pressure (BP) was elevated above 135/85 this visit, please have this repeated by your doctor within one month. --------------

## 2015-02-11 LAB — GC/CHLAMYDIA PROBE AMP (~~LOC~~) NOT AT ARMC
Chlamydia: NEGATIVE
Neisseria Gonorrhea: NEGATIVE

## 2015-05-30 ENCOUNTER — Other Ambulatory Visit: Payer: Self-pay | Admitting: Internal Medicine

## 2015-05-30 DIAGNOSIS — Z1231 Encounter for screening mammogram for malignant neoplasm of breast: Secondary | ICD-10-CM

## 2015-06-04 ENCOUNTER — Ambulatory Visit: Payer: Medicaid Other

## 2015-12-19 ENCOUNTER — Encounter: Payer: Self-pay | Admitting: Internal Medicine

## 2016-04-25 ENCOUNTER — Emergency Department (HOSPITAL_BASED_OUTPATIENT_CLINIC_OR_DEPARTMENT_OTHER)
Admission: EM | Admit: 2016-04-25 | Discharge: 2016-04-25 | Disposition: A | Discharge status: Home / Self Care | Payer: Medicaid Other | Attending: Emergency Medicine | Admitting: Emergency Medicine

## 2016-04-25 ENCOUNTER — Encounter (HOSPITAL_BASED_OUTPATIENT_CLINIC_OR_DEPARTMENT_OTHER): Payer: Self-pay | Admitting: *Deleted

## 2016-04-25 DIAGNOSIS — E1165 Type 2 diabetes mellitus with hyperglycemia: Secondary | ICD-10-CM | POA: Diagnosis present

## 2016-04-25 DIAGNOSIS — R110 Nausea: Secondary | ICD-10-CM | POA: Insufficient documentation

## 2016-04-25 DIAGNOSIS — I10 Essential (primary) hypertension: Secondary | ICD-10-CM | POA: Diagnosis not present

## 2016-04-25 DIAGNOSIS — R739 Hyperglycemia, unspecified: Secondary | ICD-10-CM

## 2016-04-25 DIAGNOSIS — Z794 Long term (current) use of insulin: Secondary | ICD-10-CM | POA: Insufficient documentation

## 2016-04-25 DIAGNOSIS — R109 Unspecified abdominal pain: Secondary | ICD-10-CM | POA: Insufficient documentation

## 2016-04-25 DIAGNOSIS — Z7984 Long term (current) use of oral hypoglycemic drugs: Secondary | ICD-10-CM | POA: Diagnosis not present

## 2016-04-25 DIAGNOSIS — E876 Hypokalemia: Secondary | ICD-10-CM | POA: Diagnosis not present

## 2016-04-25 DIAGNOSIS — Z79899 Other long term (current) drug therapy: Secondary | ICD-10-CM | POA: Diagnosis not present

## 2016-04-25 DIAGNOSIS — Z6841 Body Mass Index (BMI) 40.0 and over, adult: Secondary | ICD-10-CM | POA: Insufficient documentation

## 2016-04-25 LAB — CBC WITH DIFFERENTIAL/PLATELET
BASOS ABS: 0.1 10*3/uL (ref 0.0–0.1)
Basophils Relative: 1 %
Eosinophils Absolute: 0.2 10*3/uL (ref 0.0–0.7)
Eosinophils Relative: 4 %
HCT: 39.6 % (ref 36.0–46.0)
Hemoglobin: 14 g/dL (ref 12.0–15.0)
LYMPHS ABS: 2.8 10*3/uL (ref 0.7–4.0)
Lymphocytes Relative: 46 %
MCH: 29.6 pg (ref 26.0–34.0)
MCHC: 35.4 g/dL (ref 30.0–36.0)
MCV: 83.7 fL (ref 78.0–100.0)
Monocytes Absolute: 0.6 10*3/uL (ref 0.1–1.0)
Monocytes Relative: 10 %
Neutro Abs: 2.4 10*3/uL (ref 1.7–7.7)
Neutrophils Relative %: 39 %
Platelets: 220 10*3/uL (ref 150–400)
RBC: 4.73 MIL/uL (ref 3.87–5.11)
RDW: 12 % (ref 11.5–15.5)
WBC: 6.1 10*3/uL (ref 4.0–10.5)

## 2016-04-25 LAB — CBG MONITORING, ED: GLUCOSE-CAPILLARY: 337 mg/dL — AB (ref 65–99)

## 2016-04-25 LAB — COMPREHENSIVE METABOLIC PANEL
ALBUMIN: 4 g/dL (ref 3.5–5.0)
ALT: 38 U/L (ref 14–54)
AST: 29 U/L (ref 15–41)
Alkaline Phosphatase: 73 U/L (ref 38–126)
Anion gap: 11 (ref 5–15)
BUN: 18 mg/dL (ref 6–20)
CHLORIDE: 95 mmol/L — AB (ref 101–111)
CO2: 29 mmol/L (ref 22–32)
Calcium: 9.1 mg/dL (ref 8.9–10.3)
Creatinine, Ser: 1.05 mg/dL — ABNORMAL HIGH (ref 0.44–1.00)
GFR calc Af Amer: >60 mL/min (ref >60)
GFR calc non Af Amer: >60 mL/min (ref >60)
GLUCOSE: 311 mg/dL — AB (ref 65–99)
POTASSIUM: 3.2 mmol/L — AB (ref 3.5–5.1)
SODIUM: 135 mmol/L (ref 135–145)
Total Bilirubin: 0.5 mg/dL (ref 0.3–1.2)
Total Protein: 7.7 g/dL (ref 6.5–8.1)

## 2016-04-25 LAB — TROPONIN I

## 2016-04-25 MED ORDER — POTASSIUM CHLORIDE CRYS ER 20 MEQ PO TBCR: 20.0000 meq | EXTENDED_RELEASE_TABLET | Freq: Every day | ORAL | Status: AC

## 2016-04-25 MED ORDER — SODIUM CHLORIDE 0.9 % IV BOLUS (SEPSIS)
2000.0000 mL | Freq: Once | INTRAVENOUS | Status: AC
  Administered 2016-04-25: 2000 mL via INTRAVENOUS

## 2016-04-25 MED ORDER — POTASSIUM CHLORIDE CRYS ER 20 MEQ PO TBCR
40.0000 meq | EXTENDED_RELEASE_TABLET | Freq: Once | ORAL | Status: AC
  Administered 2016-04-25: 40 meq via ORAL
  Filled 2016-04-25: qty 2

## 2016-04-25 NOTE — ED Notes (Signed)
Updated about continued need for labs (1st attempt clotted, 2nd attempt hemolyzed).

## 2016-04-25 NOTE — ED Notes (Signed)
Attempted IV x2 with [place] (unsuccessful), blood obtained.

## 2016-04-25 NOTE — ED Provider Notes (Signed)
CSN: [id number]     Arrival date & time 04/25/16  1836 History  By signing my name below, I, [facility], attest that this documentation has been prepared under the direction and in the presence of Sherwood Gambler, MD. Electronically Signed: Virgel Bouquet, ED Scribe. 04/25/2016. 10:45 PM.   Chief Complaint  Patient presents with  . Hyperglycemia   The history is provided by the patient. No language interpreter was used.  HPI Comments: [name] is a 50 y.o. female who presents to the Emergency Department complaining of intermittent, mild dizziness onset 4 days ago. Patient states that she began taking a new medication for appetite suppression 2 weeks ago and has had elevated CBGs as high as the 500s. She reports fatigue, lightheadedness, loss of appetite, nausea, polydipsia, diarrhea for 3 days, completely resolved abdominal pain yesterday, HAs for 2 days. Dizziness worse with standing and sitting up (not present now). Denies vomiting, CP, dysuria, weakness.  Past Medical History  Diagnosis Date  . Hypertension   . Diabetes mellitus   . [id number]    Past Surgical History  Procedure Laterality Date  . Breast lumpectomy    . Appendectomy    . Abdominal hysterectomy    . Cesarean section     Family History  Problem Relation Age of Onset  . Hypertension Father   . Stroke Father   . Cancer Sister 85    breast  . Diabetes Brother   . Cancer Paternal Aunt     breast  . Diabetes Paternal Aunt   . Cancer Maternal Grandmother     breast   Social History  Substance Use Topics  . Smoking status: Never Smoker   . Smokeless tobacco: Never Used  . Alcohol Use: No   OB History    Gravida Para Term Preterm AB TAB SAB Ectopic Multiple Living   2 2 2       2      Review of Systems  Constitutional: Positive for appetite change and fatigue. Negative for fever.  Cardiovascular: Negative for chest pain.  Gastrointestinal: Positive for nausea and abdominal pain.  Negative for vomiting.  Endocrine: Positive for polydipsia.  Genitourinary: Negative for dysuria.  Neurological: Positive for dizziness, light-headedness and headaches. Negative for weakness.  All other systems reviewed and are negative.   Allergies  Shellfish allergy; Benadryl; and Penicillins  Home Medications   Prior to Admission medications   Medication Sig Start Date End Date Taking? Authorizing Provider  Dulaglutide (TRULICITY [place]) Inject into the skin once a week.   Yes Historical Provider, MD  cetirizine (ZYRTEC) 10 MG tablet Take 10 mg by mouth daily.    Historical Provider, MD  Cholecalciferol (VITAMIN D PO) Take by mouth.    Historical Provider, MD  diclofenac (VOLTAREN) 75 MG EC tablet Take 75 mg by mouth 2 (two) times daily.    Historical Provider, MD  fluconazole (DIFLUCAN) 150 MG tablet Take 1 tablet (150 mg total) by mouth as directed. Take first tablet on 02/16 and second tablet on 02/19. 02/08/15   Carlisle Cater, PA-C  gabapentin (NEURONTIN) 100 MG capsule Take 100 mg by mouth 3 (three) times daily.    Historical Provider, MD  hydrochlorothiazide (HYDRODIURIL) 50 MG tablet Take 50 mg by mouth daily.    Historical Provider, MD  HYDROcodone-acetaminophen (NORCO/VICODIN) 5-325 MG per tablet Take 2 tablets by mouth every 6 (six) hours as needed for pain. 06/30/13   Calvert Cantor, MD  HYDROcodone-acetaminophen (NORCO/VICODIN) 5-325 MG per tablet Take  1-2 tablets by mouth every 6 (six) hours as needed for moderate pain. 05/28/14   John Molpus, MD  insulin glargine (LANTUS) 100 UNIT/ML injection Inject 20 Units into the skin at bedtime.     Historical Provider, MD  losartan (COZAAR) 100 MG tablet Take 100 mg by mouth daily.    Historical Provider, MD  metFORMIN (GLUCOPHAGE) 500 MG tablet Take 500 mg by mouth 2 (two) times daily with a meal.    Historical Provider, MD  METOPROLOL TARTRATE PO Take 50 mg by mouth 2 (two) times daily.     Historical Provider, MD  ondansetron (ZOFRAN  ODT) 8 MG disintegrating tablet 8mg  ODT q4 hours prn nausea 02/25/14   Malvin Johns, MD  oxyCODONE-acetaminophen (PERCOCET/ROXICET) 5-325 MG per tablet Take 1-2 tablets by mouth every 4 (four) hours as needed for severe pain.    Historical Provider, MD  phentermine 37.5 MG capsule Take 37.5 mg by mouth every morning.    Historical Provider, MD  SIMVASTATIN PO Take 10 mg by mouth at bedtime.     Historical Provider, MD  zolpidem (AMBIEN) 5 MG tablet Take 10 mg by mouth at bedtime as needed for sleep.    Historical Provider, MD   BP 114/96 mmHg  Pulse 79  Temp(Src) 97.9 F (36.6 C) (Oral)  Resp 18  Ht 5\' 8"  (1.727 m)  Wt 350 lb (158.759 kg)  BMI 53.23 kg/m2  SpO2 99% Physical Exam  Constitutional: She is oriented to person, place, and time. She appears well-developed and well-nourished. No distress.  Morbidly obese.  HENT:  Head: Normocephalic and atraumatic.  Right Ear: External ear normal.  Left Ear: External ear normal.  Nose: Nose normal.  Eyes: EOM are normal. Pupils are equal, round, and reactive to light. Right eye exhibits no discharge. Left eye exhibits no discharge.  Cardiovascular: Normal rate, regular rhythm and normal heart sounds.   Pulmonary/Chest: Effort normal and breath sounds normal.  Abdominal: Soft. There is no tenderness.  Neurological: She is alert and oriented to person, place, and time.  Cranial nerves II-XII grossly intact. 5/5 strength in all four extremities. Finger to nose was normal.  Skin: Skin is warm and dry. She is not diaphoretic.  Nursing note and vitals reviewed.   ED Course  Procedures   DIAGNOSTIC STUDIES: Oxygen Saturation is 95% on RA, normal by my interpretation.    COORDINATION OF CARE: 7:48 PM Will order IV fluids and labs. Discussed treatment plan with pt at bedside and pt agreed to plan.  8:43 PM Spoke with pt's nurse who stated that IV insertion was unsuccessful after multiple attempts and that the pt refused another attempt. Will  provide pt with water.  Labs Review Labs Reviewed  COMPREHENSIVE METABOLIC PANEL - Abnormal; Notable for the following:    Potassium 3.2 (*)    Chloride 95 (*)    Glucose, Bld 311 (*)    Creatinine, Ser 1.05 (*)    All other components within normal limits  CBG MONITORING, ED - Abnormal; Notable for the following:    Glucose-Capillary 337 (*)    All other components within normal limits  TROPONIN I  CBC WITH DIFFERENTIAL/PLATELET  CBC WITH DIFFERENTIAL/PLATELET  URINALYSIS, ROUTINE W REFLEX MICROSCOPIC (NOT AT [facility])    EKG Interpretation  Date/Time:  Sunday April 25 2016 20:39:42 EDT Ventricular Rate:  76 PR Interval:  188 QRS Duration: 98 QT Interval:  426 QTC Calculation: 479 R Axis:   22 Text Interpretation:  Sinus rhythm Abnormal  T, consider ischemia, anterior leads T wave inversions new compared to 2013 Confirmed by Kaoru Benda  MD, Rittman [phone]) on 04/25/2016 8:59:54 PM       I have personally reviewed and evaluated these lab results as part of my medical decision-making.  Angiocath insertion Performed by: Sherwood Gambler T  Consent: Verbal consent obtained. Risks and benefits: risks, benefits and alternatives were discussed Time out: Immediately prior to procedure a "time out" was called to verify the correct patient, procedure, equipment, support staff and site/side marked as required.  Preparation: Patient was prepped and draped in the usual sterile fashion.  Vein Location: right basilic  Ultrasound Guided  Gauge: 20  Normal blood return and flush without difficulty Patient tolerance: Patient tolerated the procedure well with no immediate complications.    MDM   Final diagnoses:  Hyperglycemia  Hypokalemia    I believe patient's symptoms are coming from hyperglycemia and subsequent dehydration. Mild hypokalemia. No chest pain, dyspnea. I doubt this is atypical ACS. She does have nonspecific T wave inversions but denies chest pain multiple times. Feels  better after fluids. Does not have UTI symptoms and doesn't want to wait for a urinalysis, wants to leave now. Will replace K, encourage close f/u with PCP  I personally performed the services described in this documentation, which was scribed in my presence. The recorded information has been reviewed and is accurate.    Sherwood Gambler, MD 04/26/16 [phone]

## 2016-04-25 NOTE — ED Notes (Addendum)
Patient c/o elevated blood sugars. Patient states that her MD prescribed a medicine the suppress her appetite. She also c/o fatigue and decreased appetite & headaches at times. Glucose checked at triage 337

## 2016-04-25 NOTE — ED Notes (Signed)
MD at bedside. 

## 2016-04-25 NOTE — ED Notes (Signed)
Per pt report started on new DM medication that she takes once a week and fat loss medications. Been feeling thirsty . Poor appetite with nausea.

## 2016-04-25 NOTE — ED Notes (Signed)
Delay in EKG due to an iv trying to be started.

## 2016-06-04 ENCOUNTER — Encounter (HOSPITAL_BASED_OUTPATIENT_CLINIC_OR_DEPARTMENT_OTHER): Payer: Self-pay | Admitting: *Deleted

## 2016-06-04 ENCOUNTER — Emergency Department (HOSPITAL_BASED_OUTPATIENT_CLINIC_OR_DEPARTMENT_OTHER)
Admission: EM | Admit: 2016-06-04 | Discharge: 2016-06-05 | Disposition: A | Discharge status: Home / Self Care | Payer: Medicaid Other | Attending: Emergency Medicine | Admitting: Emergency Medicine

## 2016-06-04 DIAGNOSIS — B373 Candidiasis of vulva and vagina: Secondary | ICD-10-CM

## 2016-06-04 DIAGNOSIS — I10 Essential (primary) hypertension: Secondary | ICD-10-CM | POA: Diagnosis not present

## 2016-06-04 DIAGNOSIS — B372 Candidiasis of skin and nail: Secondary | ICD-10-CM | POA: Diagnosis not present

## 2016-06-04 DIAGNOSIS — R21 Rash and other nonspecific skin eruption: Secondary | ICD-10-CM | POA: Diagnosis present

## 2016-06-04 DIAGNOSIS — Z794 Long term (current) use of insulin: Secondary | ICD-10-CM | POA: Diagnosis not present

## 2016-06-04 DIAGNOSIS — E119 Type 2 diabetes mellitus without complications: Secondary | ICD-10-CM | POA: Insufficient documentation

## 2016-06-04 DIAGNOSIS — Z7984 Long term (current) use of oral hypoglycemic drugs: Secondary | ICD-10-CM | POA: Diagnosis not present

## 2016-06-04 DIAGNOSIS — B3731 Acute candidiasis of vulva and vagina: Secondary | ICD-10-CM

## 2016-06-04 LAB — WET PREP, GENITAL
Clue Cells Wet Prep HPF POC: NONE SEEN
Sperm: NONE SEEN
Trich, Wet Prep: NONE SEEN

## 2016-06-04 NOTE — ED Provider Notes (Signed)
CSN: [id number]     Arrival date & time 06/04/16  2002 History   First MD Initiated Contact with Patient 06/04/16 2242     Chief Complaint  Patient presents with  . Rash     (Consider location/radiation/quality/duration/timing/severity/associated sxs/prior Treatment) HPI Comments: Patient is a morbidly obese 50 year old female with history of diabetes who presents to rash to her suprapubic area, vaginal area, and inner thighs. Patient reports she has had this rash for 1 week. She describes the rash as irritated, painful, and itchy. Patient denies any abnormal discharge or vaginal bleeding. Patient is not sexually active and has not been for many years. Patient denies using any new soaps. Patient has tried a diaper rash cream, Monistat, and cortisone cream without relief. Patient has had some associated dysuria, an itching feeling on urination, that began today. Patient denies any chest pain, shortness of breath, abdominal pain, nausea, vomiting.  Patient is a 50 y.o. female presenting with rash. The history is provided by the patient.  Rash Associated symptoms: no abdominal pain, no fever, no headaches, no nausea, no shortness of breath, no sore throat and not vomiting     Past Medical History  Diagnosis Date  . Hypertension   . Diabetes mellitus   . [id number])    Past Surgical History  Procedure Laterality Date  . Breast lumpectomy    . Appendectomy    . Abdominal hysterectomy    . Cesarean section     Family History  Problem Relation Age of Onset  . Hypertension Father   . Stroke Father   . Cancer Sister 68    breast  . Diabetes Brother   . Cancer Paternal Aunt     breast  . Diabetes Paternal Aunt   . Cancer Maternal Grandmother     breast   Social History  Substance Use Topics  . Smoking status: Never Smoker   . Smokeless tobacco: Never Used  . Alcohol Use: No   OB History    Gravida Para Term Preterm AB TAB SAB Ectopic Multiple Living   2 2 2       2       Review of Systems  Constitutional: Negative for fever and chills.  HENT: Negative for facial swelling and sore throat.   Respiratory: Negative for shortness of breath.   Cardiovascular: Negative for chest pain.  Gastrointestinal: Negative for nausea, vomiting and abdominal pain.  Genitourinary: Positive for dysuria. Negative for vaginal bleeding and vaginal discharge.  Musculoskeletal: Negative for back pain.  Skin: Positive for rash. Negative for wound.  Neurological: Negative for headaches.  Psychiatric/Behavioral: The patient is not nervous/anxious.       Allergies  Shellfish allergy; Benadryl; and Penicillins  Home Medications   Prior to Admission medications   Medication Sig Start Date End Date Taking? Authorizing Provider  cetirizine (ZYRTEC) 10 MG tablet Take 10 mg by mouth daily.    Historical Provider, MD  Cholecalciferol (VITAMIN D PO) Take by mouth.    Historical Provider, MD  clotrimazole (LOTRIMIN) 1 % cream Apply 1 application topically 2 (two) times daily. 06/05/16   Frederica Kuster, PA-C  diclofenac (VOLTAREN) 75 MG EC tablet Take 75 mg by mouth 2 (two) times daily.    Historical Provider, MD  gabapentin (NEURONTIN) 100 MG capsule Take 100 mg by mouth 3 (three) times daily.    Historical Provider, MD  hydrochlorothiazide (HYDRODIURIL) 50 MG tablet Take 50 mg by mouth daily.    Historical Provider, MD  HYDROcodone-acetaminophen (  NORCO/VICODIN) 5-325 MG per tablet Take 2 tablets by mouth every 6 (six) hours as needed for pain. 06/30/13   Calvert Cantor, MD  HYDROcodone-acetaminophen (NORCO/VICODIN) 5-325 MG per tablet Take 1-2 tablets by mouth every 6 (six) hours as needed for moderate pain. 05/28/14   John Molpus, MD  insulin glargine (LANTUS) 100 UNIT/ML injection Inject 20 Units into the skin at bedtime.     Historical Provider, MD  losartan (COZAAR) 100 MG tablet Take 100 mg by mouth daily.    Historical Provider, MD  metFORMIN (GLUCOPHAGE) 500 MG tablet Take 500 mg  by mouth 2 (two) times daily with a meal.    Historical Provider, MD  METOPROLOL TARTRATE PO Take 50 mg by mouth 2 (two) times daily.     Historical Provider, MD  ondansetron (ZOFRAN ODT) 8 MG disintegrating tablet 8mg  ODT q4 hours prn nausea 02/25/14   Malvin Johns, MD  oxyCODONE-acetaminophen (PERCOCET/ROXICET) 5-325 MG per tablet Take 1-2 tablets by mouth every 4 (four) hours as needed for severe pain.    Historical Provider, MD  phentermine 37.5 MG capsule Take 37.5 mg by mouth every morning.    Historical Provider, MD  potassium chloride SA (K-DUR,KLOR-CON) 20 MEQ tablet Take 1 tablet (20 mEq total) by mouth daily. 04/25/16   Sherwood Gambler, MD  SIMVASTATIN PO Take 10 mg by mouth at bedtime.     Historical Provider, MD  zolpidem (AMBIEN) 5 MG tablet Take 10 mg by mouth at bedtime as needed for sleep.    Historical Provider, MD   BP 150/73 mmHg  Pulse 81  Temp(Src) 98.5 F (36.9 C) (Oral)  Resp 16  Ht 5\' 8"  (1.727 m)  Wt 158.759 kg  BMI 53.23 kg/m2  SpO2 100% Physical Exam  Constitutional: She appears well-developed and well-nourished. No distress.  HENT:  Head: Normocephalic and atraumatic.  Mouth/Throat: Oropharynx is clear and moist. No oropharyngeal exudate.  Eyes: Conjunctivae are normal. Pupils are equal, round, and reactive to light. Right eye exhibits no discharge. Left eye exhibits no discharge. No scleral icterus.  Neck: Normal range of motion. Neck supple. No thyromegaly present.  Cardiovascular: Normal rate, regular rhythm, normal heart sounds and intact distal pulses.  Exam reveals no gallop and no friction rub.   No murmur heard. Pulmonary/Chest: Effort normal and breath sounds normal. No stridor. No respiratory distress. She has no wheezes. She has no rales.  Abdominal: Soft. Bowel sounds are normal. She exhibits no distension. There is no tenderness. There is no rebound and no guarding.  Genitourinary: There is rash on the right labia. There is no tenderness on the  right labia. There is rash on the left labia. There is no tenderness on the left labia. Uterus is not deviated, not enlarged, not fixed and not tender. Cervix exhibits discharge. Cervix exhibits no motion tenderness and no friability. Right adnexum displays no mass, no tenderness and no fullness. Left adnexum displays no mass, no tenderness and no fullness. There is erythema in the vagina. No bleeding in the vagina. Vaginal discharge (white, cottage cheese like) found.  Musculoskeletal: She exhibits no edema.  Lymphadenopathy:    She has no cervical adenopathy.  Neurological: She is alert. Coordination normal.  Skin: Skin is warm and dry. No rash noted. She is not diaphoretic. No pallor.  Areas of erythema under abdominal skin folds, inner thighs, suprapubic pubic area, and vaginal area with satellite lesions  Psychiatric: She has a normal mood and affect.  Nursing note and vitals reviewed.  ED Course  Procedures (including critical care time) Labs Review Labs Reviewed  WET PREP, GENITAL - Abnormal; Notable for the following:    Yeast Wet Prep HPF POC PRESENT (*)    WBC, Wet Prep HPF POC MANY (*)    All other components within normal limits  URINALYSIS, ROUTINE W REFLEX MICROSCOPIC (NOT AT [facility]) - Abnormal; Notable for the following:    APPearance CLOUDY (*)    Specific Gravity, Urine >1.046 (*)    Glucose, UA >1000 (*)    Ketones, ur 40 (*)    All other components within normal limits  URINE MICROSCOPIC-ADD ON - Abnormal; Notable for the following:    Squamous Epithelial / LPF 6-30 (*)    Bacteria, UA FEW (*)    All other components within normal limits  URINE CULTURE  GC/CHLAMYDIA PROBE AMP ([place]) NOT AT [facility]    Imaging Review No results found. I have personally reviewed and evaluated these images and lab results as part of my medical decision-making.   EKG Interpretation None      MDM   Cutaneous and vaginal candidiasis. Wet prep shows yeast. UA shows few  bacteria and no leukocytes, however I suspect dirty specimen and patient's dysuria due to yeast. Urine culture sent, would treat with antibiotic if culture returns positive. Patient treated with single dose of fluconazole 150 mg in ED. Patient also discussed charged with clotrimazole cream. Patient advised to take NSAIDs or Tylenol for pain at home. Patient advised to follow up with PCP at her scheduled appointment on Thursday for recheck and follow up and better control of her diabetes. Patient vitals stable throughout ED course and discharged in satisfactory condition. Return precautions given.  Final diagnoses:  Candidiasis of vagina  Candidiasis, intertrigo        Frederica Kuster, PA-C 06/05/16 [place], PA-C 06/05/16 1541  Harvel Quale, MD 06/07/16 2324

## 2016-06-04 NOTE — ED Notes (Signed)
Pt c/o rash to groin area x 1 week

## 2016-06-04 NOTE — ED Notes (Signed)
Pt denies vaginal discharge or bleeding denies burning or pain with urination

## 2016-06-05 LAB — URINE MICROSCOPIC-ADD ON

## 2016-06-05 LAB — URINALYSIS, ROUTINE W REFLEX MICROSCOPIC
Bilirubin Urine: NEGATIVE
Glucose, UA: >1000 mg/dL — AB
Hgb urine dipstick: NEGATIVE
Ketones, ur: 40 mg/dL — AB
Leukocytes, UA: NEGATIVE
Nitrite: NEGATIVE
Protein, ur: NEGATIVE mg/dL
Specific Gravity, Urine: >1.046 — ABNORMAL HIGH (ref 1.005–1.030)
pH: 5.5 (ref 5.0–8.0)

## 2016-06-05 MED ORDER — FLUCONAZOLE 100 MG PO TABS
150.0000 mg | ORAL_TABLET | Freq: Once | ORAL | Status: AC
Start: 2016-06-05 — End: 2016-06-05
  Administered 2016-06-05: 150 mg via ORAL
  Filled 2016-06-05: qty 1

## 2016-06-05 MED ORDER — IBUPROFEN 800 MG PO TABS
800.0000 mg | ORAL_TABLET | Freq: Once | ORAL | Status: AC
Start: 2016-06-05 — End: 2016-06-05
  Administered 2016-06-05: 800 mg via ORAL
  Filled 2016-06-05: qty 1

## 2016-06-05 MED ORDER — CLOTRIMAZOLE 1 % EX CREA: 1.0000 "application " | TOPICAL_CREAM | Freq: Two times a day (BID) | CUTANEOUS | Status: AC

## 2016-06-05 NOTE — Discharge Instructions (Signed)
Medications: Clotrimazole  Treatment: Apply clotrimazole cream twice daily to the affected area. Take ibuprofen or Tylenol as prescribed over-the-counter for your pain.  Follow-up: Please follow-up with your primary care provider next week your scheduled appointment for follow-up of today's visit and recheck of your symptoms. Please return to emergency department if you develop any new or worsening symptoms.   Monilial Vaginitis Vaginitis in a soreness, swelling and redness (inflammation) of the vagina and vulva. Monilial vaginitis is not a sexually transmitted infection. CAUSES  Yeast vaginitis is caused by yeast (candida) that is normally found in your vagina. With a yeast infection, the candida has overgrown in number to a point that upsets the chemical balance. SYMPTOMS   White, thick vaginal discharge.  Swelling, itching, redness and irritation of the vagina and possibly the lips of the vagina (vulva).  Burning or painful urination.  Painful intercourse. DIAGNOSIS  Things that may contribute to monilial vaginitis are:  Postmenopausal and virginal states.  Pregnancy.  Infections.  Being tired, sick or stressed, especially if you had monilial vaginitis in the past.  Diabetes. Good control will help lower the chance.  Birth control pills.  Tight fitting garments.  Using bubble bath, feminine sprays, douches or deodorant tampons.  Taking certain medications that kill germs (antibiotics).  Sporadic recurrence can occur if you become ill. TREATMENT  Your caregiver will give you medication.  There are several kinds of anti monilial vaginal creams and suppositories specific for monilial vaginitis. For recurrent yeast infections, use a suppository or cream in the vagina 2 times a week, or as directed.  Anti-monilial or steroid cream for the itching or irritation of the vulva may also be used. Get your caregiver's permission.  Painting the vagina with methylene blue  solution may help if the monilial cream does not work.  Eating yogurt may help prevent monilial vaginitis. HOME CARE INSTRUCTIONS   Finish all medication as prescribed.  Do not have sex until treatment is completed or after your caregiver tells you it is okay.  Take warm sitz baths.  Do not douche.  Do not use tampons, especially scented ones.  Wear cotton underwear.  Avoid tight pants and panty hose.  Tell your sexual partner that you have a yeast infection. They should go to their caregiver if they have symptoms such as mild rash or itching.  Your sexual partner should be treated as well if your infection is difficult to eliminate.  Practice safer sex. Use condoms.  Some vaginal medications cause latex condoms to fail. Vaginal medications that harm condoms are:  Cleocin cream.  Butoconazole (Femstat).  Terconazole (Terazol) vaginal suppository.  Miconazole (Monistat) (may be purchased over the counter). SEEK MEDICAL CARE IF:   You have a temperature by mouth above 102 F (38.9 C).  The infection is getting worse after 2 days of treatment.  The infection is not getting better after 3 days of treatment.  You develop blisters in or around your vagina.  You develop vaginal bleeding, and it is not your menstrual period.  You have pain when you urinate.  You develop intestinal problems.  You have pain with sexual intercourse.   This information is not intended to replace advice given to you by your health care provider. Make sure you discuss any questions you have with your health care provider.   Document Released: 09/22/2005 Document Revised: 03/06/2012 Document Reviewed: 06/16/2015 Elsevier Interactive Patient Education 2016 [employer]  Cutaneous Candidiasis Cutaneous candidiasis is a condition in which there is  an overgrowth of yeast (candida) on the skin. Yeast normally live on the skin, but in small enough numbers not to cause any symptoms. In  certain cases, increased growth of the yeast may cause an actual yeast infection. This kind of infection usually occurs in areas of the skin that are constantly warm and moist, such as the armpits or the groin. Yeast is the most common cause of diaper rash in babies and in people who cannot control their bowel movements (incontinence). CAUSES  The fungus that most often causes cutaneous candidiasis is Candida albicans. Conditions that can increase the risk of getting a yeast infection of the skin include:  Obesity.  Pregnancy.  Diabetes.  Taking antibiotic medicine.  Taking birth control pills.  Taking steroid medicines.  Thyroid disease.  An iron or zinc deficiency.  Problems with the immune system. SYMPTOMS   Red, swollen area of the skin.  Bumps on the skin.  Itchiness. DIAGNOSIS  The diagnosis of cutaneous candidiasis is usually based on its appearance. Light scrapings of the skin may also be taken and viewed under a microscope to identify the presence of yeast. TREATMENT  Antifungal creams may be applied to the infected skin. In severe cases, oral medicines may be needed.  HOME CARE INSTRUCTIONS   Keep your skin clean and dry.  Maintain a healthy weight.  If you have diabetes, keep your blood sugar under control. SEEK IMMEDIATE MEDICAL CARE IF:  Your rash continues to spread despite treatment.  You have a fever, chills, or abdominal pain.   This information is not intended to replace advice given to you by your health care provider. Make sure you discuss any questions you have with your health care provider.   Document Released: 08/31/2011 Document Revised: 03/06/2012 Document Reviewed: 06/16/2015 Elsevier Interactive Patient Education [employer].

## 2016-06-06 LAB — URINE CULTURE: Special Requests: NORMAL

## 2016-06-07 LAB — GC/CHLAMYDIA PROBE AMP (~~LOC~~) NOT AT ARMC
Chlamydia: NEGATIVE
Neisseria Gonorrhea: NEGATIVE

## 2018-07-10 ENCOUNTER — Encounter: Payer: Self-pay | Admitting: Gastroenterology

## 2018-07-11 ENCOUNTER — Other Ambulatory Visit: Payer: Self-pay | Admitting: Internal Medicine

## 2018-07-11 DIAGNOSIS — E2839 Other primary ovarian failure: Secondary | ICD-10-CM

## 2018-07-11 DIAGNOSIS — Z1231 Encounter for screening mammogram for malignant neoplasm of breast: Secondary | ICD-10-CM

## 2018-08-07 ENCOUNTER — Telehealth: Payer: Self-pay | Admitting: *Deleted

## 2018-08-07 NOTE — Telephone Encounter (Signed)
Patient is scheduled for a direct screening colonoscopy with you. Her last BMI=52.9 on 02/23/18, weight 348 lbs. Since she can not be done here at [facility] would you like the patient to have office visit first or direct hospital colonoscopy? Please advise. Thank you,Robbin (pre-visit).

## 2018-08-08 NOTE — Telephone Encounter (Signed)
Let me see her in the office since she is high risk before scheduling for colonoscopy

## 2018-08-08 NOTE — Telephone Encounter (Signed)
[facility] make OV [facility]

## 2018-08-09 NOTE — Telephone Encounter (Signed)
Attempted pt at home and mobile numbers, no answer- Lm to return call to [phone] as soon as possible Lelan Pons [facility]

## 2018-08-10 NOTE — Telephone Encounter (Signed)
Patient was called to schedule OV with Dr. Lyndel Safe. No answer. Left message to call [phone].   Riki Sheer, LPN ( [facility] )

## 2018-08-11 NOTE — Telephone Encounter (Signed)
Mailed letter to patient today instructed her to call our office to make [place].

## 2018-08-11 NOTE — Telephone Encounter (Signed)
No answer, left message for patient to call us back to make OV and notified her that the colon and pre-visit appointments are cancelled.

## 2018-08-23 ENCOUNTER — Other Ambulatory Visit: Payer: Self-pay

## 2018-08-23 ENCOUNTER — Ambulatory Visit: Payer: Self-pay

## 2018-08-24 ENCOUNTER — Other Ambulatory Visit: Payer: Self-pay | Admitting: Internal Medicine

## 2018-08-24 DIAGNOSIS — Z1231 Encounter for screening mammogram for malignant neoplasm of breast: Secondary | ICD-10-CM

## 2018-08-31 ENCOUNTER — Encounter: Payer: Self-pay | Admitting: Gastroenterology

## 2018-12-30 ENCOUNTER — Encounter (HOSPITAL_BASED_OUTPATIENT_CLINIC_OR_DEPARTMENT_OTHER): Payer: Self-pay | Admitting: Emergency Medicine

## 2018-12-30 ENCOUNTER — Emergency Department (HOSPITAL_BASED_OUTPATIENT_CLINIC_OR_DEPARTMENT_OTHER)
Admission: EM | Admit: 2018-12-30 | Discharge: 2018-12-30 | Disposition: A | Discharge status: Home / Self Care | Payer: Medicaid Other | Attending: Emergency Medicine | Admitting: Emergency Medicine

## 2018-12-30 ENCOUNTER — Other Ambulatory Visit: Payer: Self-pay

## 2018-12-30 ENCOUNTER — Emergency Department (HOSPITAL_BASED_OUTPATIENT_CLINIC_OR_DEPARTMENT_OTHER): Payer: Medicaid Other

## 2018-12-30 DIAGNOSIS — J029 Acute pharyngitis, unspecified: Secondary | ICD-10-CM | POA: Insufficient documentation

## 2018-12-30 DIAGNOSIS — R062 Wheezing: Secondary | ICD-10-CM | POA: Diagnosis not present

## 2018-12-30 DIAGNOSIS — E119 Type 2 diabetes mellitus without complications: Secondary | ICD-10-CM | POA: Diagnosis not present

## 2018-12-30 DIAGNOSIS — I10 Essential (primary) hypertension: Secondary | ICD-10-CM | POA: Insufficient documentation

## 2018-12-30 DIAGNOSIS — R05 Cough: Secondary | ICD-10-CM | POA: Diagnosis present

## 2018-12-30 DIAGNOSIS — Z79899 Other long term (current) drug therapy: Secondary | ICD-10-CM | POA: Insufficient documentation

## 2018-12-30 DIAGNOSIS — R110 Nausea: Secondary | ICD-10-CM | POA: Diagnosis not present

## 2018-12-30 DIAGNOSIS — Z794 Long term (current) use of insulin: Secondary | ICD-10-CM | POA: Insufficient documentation

## 2018-12-30 DIAGNOSIS — R6889 Other general symptoms and signs: Secondary | ICD-10-CM

## 2018-12-30 MED ORDER — IBUPROFEN 800 MG PO TABS
800.0000 mg | ORAL_TABLET | Freq: Once | ORAL | Status: AC
  Administered 2018-12-30: 800 mg via ORAL
  Filled 2018-12-30: qty 1

## 2018-12-30 MED ORDER — ALBUTEROL SULFATE HFA 108 (90 BASE) MCG/ACT IN AERS
2.0000 | INHALATION_SPRAY | Freq: Once | RESPIRATORY_TRACT | Status: DC
  Filled 2018-12-30: qty 6.7

## 2018-12-30 MED ORDER — ONDANSETRON 4 MG PO TBDP
4.0000 mg | ORAL_TABLET | Freq: Once | ORAL | Status: AC
  Administered 2018-12-30: 4 mg via ORAL
  Filled 2018-12-30: qty 1

## 2018-12-30 MED ORDER — BENZONATATE 100 MG PO CAPS: 100.0000 mg | ORAL_CAPSULE | Freq: Three times a day (TID) | ORAL | 0 refills | Status: AC

## 2018-12-30 MED ORDER — IBUPROFEN 600 MG PO TABS: 600.0000 mg | ORAL_TABLET | Freq: Four times a day (QID) | ORAL | 0 refills | Status: AC | PRN

## 2018-12-30 MED ORDER — AEROCHAMBER PLUS FLO-VU MEDIUM MISC
1.0000 | Freq: Once | Status: DC
  Filled 2018-12-30: qty 1

## 2018-12-30 NOTE — Discharge Instructions (Addendum)

## 2018-12-30 NOTE — ED Provider Notes (Addendum)
[facility] EMERGENCY DEPARTMENT Provider Note   CSN: [id number] Arrival date & time: 12/30/18  1209     History   Chief Complaint Chief Complaint  Patient presents with  . Generalized Body Aches    HPI [name] is a 53 y.o. female.  HPI  Patient is a 53 year old female with a history of diabetes, headache, hypertension, chronic pain, who presents the emergency department today for evaluation of generalized body aches, productive cough and fever for the last week.  States that her symptoms started about a week ago with rhinorrhea, congestion and sore throat and she thought that she had the flu.  However her symptoms have continued and she has continued to have generalized body aches.  She is is in pain management and has tried taking her Percocet at home with no significant relief of her body aches.  Denies other exacerbating or alleviating factors.  She has not tried any other interventions for her symptoms.  States that she has been exposed to sick contacts at home with similar symptoms.  Past Medical History:  Diagnosis Date  . Diabetes mellitus   . Headache(784.0)   . Hypertension     Patient Active Problem List   Diagnosis Date Noted  . DM II (diabetes mellitus, type II), controlled ([place]) 11/24/2011  . HTN (hypertension) 11/24/2011  . Obesity 11/24/2011    Past Surgical History:  Procedure Laterality Date  . ABDOMINAL HYSTERECTOMY    . APPENDECTOMY    . BREAST LUMPECTOMY    . CESAREAN SECTION       OB History    Gravida  2   Para  2   Term  2   Preterm      AB      Living  2     SAB      TAB      Ectopic      Multiple      Live Births               Home Medications    Prior to Admission medications   Medication Sig Start Date End Date Taking? Authorizing Provider  cyclobenzaprine (FLEXERIL) 10 MG tablet Take 10 mg by mouth 3 (three) times daily as needed for muscle spasms.   Yes [provider]  benzonatate  (TESSALON) 100 MG capsule Take 1 capsule (100 mg total) by mouth every 8 (eight) hours. 12/30/18   Yogesh Cominsky S, PA-C  cetirizine (ZYRTEC) 10 MG tablet Take 10 mg by mouth daily.    [provider]  Cholecalciferol (VITAMIN D PO) Take by mouth.    [provider]  clotrimazole (LOTRIMIN) 1 % cream Apply 1 application topically 2 (two) times daily. 06/05/16   Law, Bea Graff, PA-C  diclofenac (VOLTAREN) 75 MG EC tablet Take 75 mg by mouth 2 (two) times daily.    [provider]  gabapentin (NEURONTIN) 100 MG capsule Take 100 mg by mouth 3 (three) times daily.    [provider]  hydrochlorothiazide (HYDRODIURIL) 50 MG tablet Take 50 mg by mouth daily.    [provider]  HYDROcodone-acetaminophen (NORCO/VICODIN) 5-325 MG per tablet Take 2 tablets by mouth every 6 (six) hours as needed for pain. 06/30/13   Calvert Cantor, MD  HYDROcodone-acetaminophen (NORCO/VICODIN) 5-325 MG per tablet Take 1-2 tablets by mouth every 6 (six) hours as needed for moderate pain. 05/28/14   Molpus, John, MD  ibuprofen (ADVIL,MOTRIN) 600 MG tablet Take 1 tablet (600 mg  total) by mouth every 6 (six) hours as needed for up to 4 days. 12/30/18 01/03/19  Osker Ayoub S, PA-C  insulin glargine (LANTUS) 100 UNIT/ML injection Inject 60 Units into the skin at bedtime.     [provider]  losartan (COZAAR) 100 MG tablet Take 100 mg by mouth daily.    [provider]  metFORMIN (GLUCOPHAGE) 500 MG tablet Take 500 mg by mouth 2 (two) times daily with a meal.    [provider]  METOPROLOL TARTRATE PO Take 50 mg by mouth 2 (two) times daily.     [provider]  ondansetron (ZOFRAN ODT) 8 MG disintegrating tablet 8mg  ODT q4 hours prn nausea 02/25/14   Malvin Johns, MD  oxyCODONE-acetaminophen (PERCOCET/ROXICET) 5-325 MG per tablet Take 1-2 tablets by mouth every 4 (four) hours as needed for severe pain.    [provider]  phentermine 37.5 MG  capsule Take 37.5 mg by mouth every morning.    [provider]  potassium chloride SA (K-DUR,KLOR-CON) 20 MEQ tablet Take 1 tablet (20 mEq total) by mouth daily. 04/25/16   Sherwood Gambler, MD  SIMVASTATIN PO Take 10 mg by mouth at bedtime.     [provider]  zolpidem (AMBIEN) 5 MG tablet Take 10 mg by mouth at bedtime as needed for sleep.    [provider]  lisinopril (PRINIVIL,ZESTRIL) 20 MG tablet Take 1 tablet (20 mg total) by mouth 2 (two) times daily. 01/26/13 06/30/13  Veryl Speak, MD    Family History Family History  Problem Relation Age of Onset  . Hypertension Father   . Stroke Father   . Cancer Sister 82       breast  . Cancer Paternal Aunt        breast  . Diabetes Paternal Aunt   . Cancer Maternal Grandmother        breast  . Diabetes Brother     Social History Social History   Tobacco Use  . Smoking status: Never Smoker  . Smokeless tobacco: Never Used  Substance Use Topics  . Alcohol use: No  . Drug use: No     Allergies   Shellfish allergy; Benadryl [diphenhydramine hcl]; and Penicillins   Review of Systems Review of Systems  Constitutional: Negative for fever.  HENT: Positive for congestion, rhinorrhea and sore throat.   Eyes: Negative for visual disturbance.  Respiratory: Positive for cough and wheezing. Negative for shortness of breath.   Cardiovascular: Negative for chest pain.  Gastrointestinal: Positive for nausea (resolved). Negative for abdominal pain, constipation, diarrhea and vomiting.  Genitourinary: Negative for pelvic pain.  Musculoskeletal: Negative for back pain.  Skin: Negative for rash.  Neurological: Negative for headaches.     Physical Exam Updated Vital Signs BP (!) 116/91 (BP Location: Left Arm)   Pulse (!) 103   Temp 100 F (37.8 C) (Oral)   Resp (!) 22   Ht 5\' 8"  (1.727 m)   Wt (!) 158.8 kg   SpO2 96%   BMI 53.22 kg/m   Physical Exam Vitals signs and nursing note reviewed.    Constitutional:      General: She is not in acute distress.    Appearance: She is well-developed. She is obese. She is not ill-appearing or toxic-appearing.  HENT:     Head: Normocephalic and atraumatic.     Nose: Congestion present.     Comments: Nasal turbinates are swollen bilaterally.    Mouth/Throat:     Mouth: Mucous  membranes are moist.     Pharynx: No oropharyngeal exudate or posterior oropharyngeal erythema.  Eyes:     Conjunctiva/sclera: Conjunctivae normal.     Pupils: Pupils are equal, round, and reactive to light.  Neck:     Musculoskeletal: Neck supple.  Cardiovascular:     Rate and Rhythm: Tachycardia present.     Heart sounds: Normal heart sounds. No murmur.  Pulmonary:     Effort: Pulmonary effort is normal. No respiratory distress.     Breath sounds: Normal breath sounds. No stridor. No wheezing or rhonchi.     Comments: Dry cough on exam. Abdominal:     General: Bowel sounds are normal. There is no distension.     Palpations: Abdomen is soft.     Tenderness: There is no abdominal tenderness. There is no guarding or rebound.  Musculoskeletal: Normal range of motion.  Lymphadenopathy:     Cervical: No cervical adenopathy.  Skin:    General: Skin is warm and dry.  Neurological:     Mental Status: She is alert.  Psychiatric:        Mood and Affect: Mood normal.      ED Treatments / Results  Labs (all labs ordered are listed, but only abnormal results are displayed) Labs Reviewed - No data to display  EKG None  Radiology Dg Chest 2 View  Result Date: 12/30/2018 CLINICAL DATA:  Body aches, cough, fever X 1 week. Pt could not get arms up any higher, she states that she has trouble with her shoulders. EXAM: CHEST - 2 VIEW COMPARISON:  05/28/2014 FINDINGS: Cardiac silhouette is normal in size and configuration. No mediastinal or hilar masses. No evidence of adenopathy. There is linear opacity in the right lower lobe extending to the lung base consistent  with atelectasis. Lungs otherwise clear. No pleural effusion or pneumothorax. Skeletal structures are intact. IMPRESSION: No active cardiopulmonary disease. Electronically Signed   By: Lajean Manes M.D.   On: 12/30/2018 14:39    Procedures Procedures (including critical care time)  Medications Ordered in ED Medications  albuterol (PROVENTIL HFA;VENTOLIN HFA) 108 (90 Base) MCG/ACT inhaler 2 puff (2 puffs Inhalation Refused 12/30/18 1435)  AEROCHAMBER PLUS FLO-VU MEDIUM MISC 1 each (1 each Other Refused 12/30/18 1435)  ibuprofen (ADVIL,MOTRIN) tablet 800 mg (800 mg Oral Given 12/30/18 1434)     Initial Impression / Assessment and Plan / ED Course  I have reviewed the triage vital signs and the nursing notes.  Pertinent labs & imaging results that were available during my care of the patient were reviewed by me and considered in my medical decision making (see chart for details).     Final Clinical Impressions(s) / ED Diagnoses   Final diagnoses:  Flu-like symptoms   Patient presenting with flulike symptoms including cough, congestion, generalized body aches and fevers at home.  Today temp is 100 Fahrenheit with heart rate of 103.  Blood pressure within normal limits.  Satting well on room air with slightly increased respiratory rate.  Lungs do sound clear on exam however she does have a dry cough.  Remainder of exam is benign.  Will check chest x-ray to rule out pneumonia.  Will give ibuprofen for low-grade temp and albuterol inhaler for reported wheezing.  She is out of the window for Tamiflu.  CXR did not show any evidence of pneumonia, pulmonary edema or pneumothorax.  She refused albuterol inhaler.  She does state that she feels mildly nauseated after taking ibuprofen.  Will give  dose of Zofran prior to DC.  She has not vomited in the ED. Suspect flu like illness.   Pt nontoxic appearing. Will give rx for motrin and benzonatate. Advised close f/u with pcp and to return to the ED for new or  worsening sxs. Pt voices understanding of the plan and reasons to return. All questions answered.  ED Discharge Orders         Ordered    benzonatate (TESSALON) 100 MG capsule  Every 8 hours     12/30/18 1506    ibuprofen (ADVIL,MOTRIN) 600 MG tablet  Every 6 hours PRN     12/30/18 1506           Clem Wisenbaker S, PA-C 12/30/18 [place], Michie, PA-C 12/30/18 [place], DO 12/30/18 1550

## 2018-12-30 NOTE — ED Triage Notes (Signed)
Pt reports generalized body aches, cough, intermittent fever. No OTC medications per pt. Has known sick contacts at home. Took Percocet at home.

## 2019-03-13 ENCOUNTER — Other Ambulatory Visit: Payer: Medicaid Other

## 2019-03-20 ENCOUNTER — Ambulatory Visit: Payer: Medicaid Other

## 2020-03-04 ENCOUNTER — Other Ambulatory Visit: Payer: Self-pay | Admitting: Nurse Practitioner

## 2020-03-04 DIAGNOSIS — N644 Mastodynia: Secondary | ICD-10-CM

## 2020-03-18 ENCOUNTER — Ambulatory Visit: Payer: Medicaid Other

## 2020-03-18 ENCOUNTER — Ambulatory Visit
Admission: RE | Admit: 2020-03-18 | Discharge: 2020-03-18 | Disposition: A | Discharge status: Home / Self Care | Payer: Medicaid Other | Source: Ambulatory Visit | Attending: Nurse Practitioner | Admitting: Nurse Practitioner

## 2020-03-18 ENCOUNTER — Other Ambulatory Visit: Payer: Self-pay

## 2020-03-18 DIAGNOSIS — N644 Mastodynia: Secondary | ICD-10-CM
